# Patient Record
Sex: Female | Born: 1965 | Race: White | Hispanic: No | Marital: Married | Smoking: Never smoker
Health system: Southern US, Community
[De-identification: ages and names within clinical notes are randomized; demographics above are authoritative.]

## PROBLEM LIST (undated history)

## (undated) DIAGNOSIS — G8929 Other chronic pain: Secondary | ICD-10-CM

## (undated) DIAGNOSIS — G629 Polyneuropathy, unspecified: Secondary | ICD-10-CM

## (undated) DIAGNOSIS — K219 Gastro-esophageal reflux disease without esophagitis: Secondary | ICD-10-CM

## (undated) DIAGNOSIS — R519 Headache, unspecified: Secondary | ICD-10-CM

## (undated) DIAGNOSIS — Z8619 Personal history of other infectious and parasitic diseases: Secondary | ICD-10-CM

## (undated) DIAGNOSIS — R51 Headache: Secondary | ICD-10-CM

## (undated) DIAGNOSIS — C9590 Leukemia, unspecified not having achieved remission: Secondary | ICD-10-CM

## (undated) DIAGNOSIS — M545 Low back pain, unspecified: Secondary | ICD-10-CM

## (undated) DIAGNOSIS — E559 Vitamin D deficiency, unspecified: Secondary | ICD-10-CM

## (undated) DIAGNOSIS — J45909 Unspecified asthma, uncomplicated: Secondary | ICD-10-CM

## (undated) DIAGNOSIS — Z9289 Personal history of other medical treatment: Secondary | ICD-10-CM

## (undated) DIAGNOSIS — Z9481 Bone marrow transplant status: Secondary | ICD-10-CM

## (undated) DIAGNOSIS — C9201 Acute myeloblastic leukemia, in remission: Secondary | ICD-10-CM

## (undated) HISTORY — PX: CHOLECYSTECTOMY: SHX55

## (undated) HISTORY — DX: Vitamin D deficiency, unspecified: E55.9

## (undated) HISTORY — PX: SHOULDER SURGERY: SHX246

## (undated) HISTORY — DX: Personal history of other infectious and parasitic diseases: Z86.19

## (undated) HISTORY — DX: Acute myeloblastic leukemia, in remission: C92.01

## (undated) HISTORY — PX: BONE MARROW TRANSPLANT: SHX200

## (undated) HISTORY — PX: SPINAL CORD STIMULATOR IMPLANT: SHX2422

## (undated) HISTORY — PX: ABDOMINAL HYSTERECTOMY: SHX81

---

## 1997-09-29 ENCOUNTER — Ambulatory Visit (HOSPITAL_COMMUNITY): Admission: RE | Admit: 1997-09-29 | Discharge: 1997-09-29 | Payer: Self-pay | Admitting: Family Medicine

## 1997-10-21 ENCOUNTER — Ambulatory Visit (HOSPITAL_COMMUNITY): Admission: RE | Admit: 1997-10-21 | Discharge: 1997-10-21 | Payer: Self-pay | Admitting: Family Medicine

## 1998-04-24 ENCOUNTER — Encounter: Payer: Self-pay | Admitting: Family Medicine

## 1998-04-24 ENCOUNTER — Ambulatory Visit (HOSPITAL_COMMUNITY): Admission: RE | Admit: 1998-04-24 | Discharge: 1998-04-24 | Payer: Self-pay | Admitting: Family Medicine

## 1999-06-13 ENCOUNTER — Ambulatory Visit (HOSPITAL_COMMUNITY): Admission: RE | Admit: 1999-06-13 | Discharge: 1999-06-13 | Payer: Self-pay | Admitting: Family Medicine

## 1999-06-13 ENCOUNTER — Encounter: Payer: Self-pay | Admitting: Family Medicine

## 2001-03-12 ENCOUNTER — Encounter: Admission: RE | Admit: 2001-03-12 | Discharge: 2001-03-12 | Payer: Self-pay | Admitting: Obstetrics and Gynecology

## 2001-03-12 ENCOUNTER — Encounter: Payer: Self-pay | Admitting: Obstetrics and Gynecology

## 2001-03-16 ENCOUNTER — Other Ambulatory Visit: Admission: RE | Admit: 2001-03-16 | Discharge: 2001-03-16 | Payer: Self-pay | Admitting: Obstetrics and Gynecology

## 2002-05-08 ENCOUNTER — Other Ambulatory Visit: Admission: RE | Admit: 2002-05-08 | Discharge: 2002-05-08 | Payer: Self-pay | Admitting: Obstetrics and Gynecology

## 2004-02-17 ENCOUNTER — Encounter: Admission: RE | Admit: 2004-02-17 | Discharge: 2004-02-17 | Payer: Self-pay | Admitting: Obstetrics and Gynecology

## 2004-02-17 ENCOUNTER — Other Ambulatory Visit: Admission: RE | Admit: 2004-02-17 | Discharge: 2004-02-17 | Payer: Self-pay | Admitting: Obstetrics and Gynecology

## 2005-03-17 ENCOUNTER — Encounter: Admission: RE | Admit: 2005-03-17 | Discharge: 2005-03-17 | Payer: Self-pay | Admitting: Obstetrics and Gynecology

## 2011-04-05 DIAGNOSIS — C9590 Leukemia, unspecified not having achieved remission: Secondary | ICD-10-CM

## 2011-04-05 HISTORY — DX: Leukemia, unspecified not having achieved remission: C95.90

## 2013-09-28 ENCOUNTER — Emergency Department (HOSPITAL_COMMUNITY)
Admission: EM | Admit: 2013-09-28 | Discharge: 2013-09-28 | Disposition: A | Discharge status: Home / Self Care | Payer: BC Managed Care – PPO | Source: Home / Self Care | Attending: Emergency Medicine | Admitting: Emergency Medicine

## 2013-09-28 ENCOUNTER — Emergency Department (INDEPENDENT_AMBULATORY_CARE_PROVIDER_SITE_OTHER): Payer: BC Managed Care – PPO

## 2013-09-28 ENCOUNTER — Encounter (HOSPITAL_COMMUNITY): Payer: Self-pay | Admitting: Emergency Medicine

## 2013-09-28 DIAGNOSIS — S93439A Sprain of tibiofibular ligament of unspecified ankle, initial encounter: Secondary | ICD-10-CM

## 2013-09-28 DIAGNOSIS — S93491A Sprain of other ligament of right ankle, initial encounter: Secondary | ICD-10-CM

## 2013-09-28 DIAGNOSIS — S93431A Sprain of tibiofibular ligament of right ankle, initial encounter: Principal | ICD-10-CM

## 2013-09-28 HISTORY — DX: Unspecified asthma, uncomplicated: J45.909

## 2013-09-28 HISTORY — DX: Other chronic pain: G89.29

## 2013-09-28 HISTORY — DX: Leukemia, unspecified not having achieved remission: C95.90

## 2013-09-28 HISTORY — DX: Bone marrow transplant status: Z94.81

## 2013-09-28 HISTORY — DX: Polyneuropathy, unspecified: G62.9

## 2013-09-28 HISTORY — DX: Low back pain, unspecified: M54.50

## 2013-09-28 HISTORY — DX: Low back pain: M54.5
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
## 2013-09-28 NOTE — Discharge Instructions (Signed)
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
## 2014-02-10 ENCOUNTER — Telehealth: Payer: Self-pay | Admitting: Hematology and Oncology

## 2014-02-10 NOTE — Telephone Encounter (Signed)
S/W PATIENT AND GAVE NP APPT FOR 11/19 @ 9:45 W/DR. [place].  WELCOME PACKET MAILED.

## 2014-02-20 ENCOUNTER — Encounter (INDEPENDENT_AMBULATORY_CARE_PROVIDER_SITE_OTHER): Payer: Self-pay

## 2014-02-20 ENCOUNTER — Ambulatory Visit (HOSPITAL_BASED_OUTPATIENT_CLINIC_OR_DEPARTMENT_OTHER): Payer: 59 | Admitting: Hematology and Oncology

## 2014-02-20 ENCOUNTER — Telehealth: Payer: Self-pay | Admitting: Hematology and Oncology

## 2014-02-20 ENCOUNTER — Encounter: Payer: Self-pay | Admitting: Hematology and Oncology

## 2014-02-20 ENCOUNTER — Ambulatory Visit (HOSPITAL_BASED_OUTPATIENT_CLINIC_OR_DEPARTMENT_OTHER): Payer: 59

## 2014-02-20 ENCOUNTER — Ambulatory Visit: Payer: 59

## 2014-02-20 VITALS — BP 113/62 | HR 70 | Temp 97.9°F | Resp 18 | Ht 60.0 in | Wt 165.9 lb

## 2014-02-20 DIAGNOSIS — G629 Polyneuropathy, unspecified: Secondary | ICD-10-CM

## 2014-02-20 DIAGNOSIS — C9201 Acute myeloblastic leukemia, in remission: Secondary | ICD-10-CM

## 2014-02-20 DIAGNOSIS — Z8619 Personal history of other infectious and parasitic diseases: Secondary | ICD-10-CM

## 2014-02-20 DIAGNOSIS — C801 Malignant (primary) neoplasm, unspecified: Secondary | ICD-10-CM

## 2014-02-20 DIAGNOSIS — M899 Disorder of bone, unspecified: Secondary | ICD-10-CM

## 2014-02-20 DIAGNOSIS — R5383 Other fatigue: Secondary | ICD-10-CM | POA: Insufficient documentation

## 2014-02-20 DIAGNOSIS — R5382 Chronic fatigue, unspecified: Secondary | ICD-10-CM

## 2014-02-20 DIAGNOSIS — M898X9 Other specified disorders of bone, unspecified site: Secondary | ICD-10-CM

## 2014-02-20 DIAGNOSIS — G63 Polyneuropathy in diseases classified elsewhere: Secondary | ICD-10-CM

## 2014-02-20 DIAGNOSIS — Z299 Encounter for prophylactic measures, unspecified: Secondary | ICD-10-CM | POA: Insufficient documentation

## 2014-02-20 HISTORY — DX: Acute myeloblastic leukemia, in remission: C92.01

## 2014-02-20 HISTORY — DX: Personal history of other infectious and parasitic diseases: Z86.19

## 2014-02-20 LAB — CBC WITH DIFFERENTIAL/PLATELET
BASO%: 0 % (ref 0.0–2.0)
BASOS ABS: 0 10*3/uL (ref 0.0–0.1)
EOS%: 2.2 % (ref 0.0–7.0)
Eosinophils Absolute: 0.1 10*3/uL (ref 0.0–0.5)
HCT: 37.8 % (ref 34.8–46.6)
HEMOGLOBIN: 12.9 g/dL (ref 11.6–15.9)
LYMPH%: 41.4 % (ref 14.0–49.7)
MCH: 32.7 pg (ref 25.1–34.0)
MCHC: 34.1 g/dL (ref 31.5–36.0)
MCV: 95.7 fL (ref 79.5–101.0)
MONO#: 0.5 10*3/uL (ref 0.1–0.9)
MONO%: 11 % (ref 0.0–14.0)
NEUT%: 45.4 % (ref 38.4–76.8)
NEUTROS ABS: 1.9 10*3/uL (ref 1.5–6.5)
Platelets: 171 10*3/uL (ref 145–400)
RBC: 3.95 10*6/uL (ref 3.70–5.45)
RDW: 12.9 % (ref 11.2–14.5)
WBC: 4.1 10*3/uL (ref 3.9–10.3)
lymph#: 1.7 10*3/uL (ref 0.9–3.3)

## 2014-02-20 LAB — COMPREHENSIVE METABOLIC PANEL (CC13)
ALBUMIN: 3.8 g/dL (ref 3.5–5.0)
ALK PHOS: 69 U/L (ref 40–150)
ALT: 16 U/L (ref 0–55)
ANION GAP: 9 meq/L (ref 3–11)
AST: 16 U/L (ref 5–34)
BILIRUBIN TOTAL: 0.4 mg/dL (ref 0.20–1.20)
BUN: 18.7 mg/dL (ref 7.0–26.0)
CO2: 25 mEq/L (ref 22–29)
Calcium: 9.7 mg/dL (ref 8.4–10.4)
Chloride: 107 mEq/L (ref 98–109)
Creatinine: 0.9 mg/dL (ref 0.6–1.1)
GLUCOSE: 90 mg/dL (ref 70–140)
POTASSIUM: 4.2 meq/L (ref 3.5–5.1)
Sodium: 141 mEq/L (ref 136–145)
Total Protein: 6.6 g/dL (ref 6.4–8.3)

## 2014-02-20 LAB — LACTATE DEHYDROGENASE (CC13): LDH: 203 U/L (ref 125–245)

## 2014-02-20 LAB — MORPHOLOGY: PLT EST: ADEQUATE

## 2014-02-20 MED ORDER — VALACYCLOVIR HCL 1 G PO TABS: 1000.0000 mg | ORAL_TABLET | Freq: Every day | ORAL | Status: DC

## 2014-02-20 MED ORDER — VALACYCLOVIR HCL 1 G PO TABS
1000.0000 mg | ORAL_TABLET | Freq: Every day | ORAL | Status: DC
Start: 2014-02-20 — End: 2014-05-23

## 2014-02-20 NOTE — Assessment & Plan Note (Signed)
I have requested the pharmacy to a pain records from [facility] regarding her vaccination records. The patient appears to be delayed in getting the appropriate vaccination according to the post-transplant preventive vaccination schedule. Once I have obtained records, we will schedule vaccination for her.

## 2014-02-20 NOTE — Progress Notes (Signed)
Checked in new pt with no financial concerns at this time.  Pt has my card for any questions or concerns.

## 2014-02-20 NOTE — Telephone Encounter (Signed)
Gave avs & cal for Feb 2016. °

## 2014-02-20 NOTE — Assessment & Plan Note (Signed)
The cause of her neuropathy is unknown. Certainly, graft-versus-host disease can cause neuropathy. She also has history of chronic back pain with neurostimulator placed. The pattern of neuropathy is more peripheral. She is responding to Lyrica. I will recommend she continue to do so.

## 2014-02-20 NOTE — Assessment & Plan Note (Signed)
She has chronic fatigue and had history of subclinical hypothyroidism. Repeat thyroid function test done recently and is normal. I reassured the patient.

## 2014-02-20 NOTE — Assessment & Plan Note (Signed)
She has recurrent shingles. I refilled her prescription of Valtrex at 1000 mg daily. I will check her immunoglobulin levels. If she has recurrence of shingles again, I might try IVIG treatment next.

## 2014-02-20 NOTE — Assessment & Plan Note (Signed)
She has chronic bone pain but clinically does not looks like graft-versus-host disease. I recommend high-dose vitamin D supplements for now. In the meantime, she is on Lyrica which seems to help.

## 2014-02-20 NOTE — Assessment & Plan Note (Addendum)
Her blood work and clinical exam suggests complete remission. I reassured the patient. However, she has a lot of other medical issues. I will see her back in 3 months with repeat history, physical examination and blood work. I asked her to sign consent to release information so I can request also records from [facility] and [employer].

## 2014-02-20 NOTE — Progress Notes (Signed)
[facility] CONSULT NOTE  Patient Care Team: Scott Holwerda, MD as PCP - General (Internal Medicine)  CHIEF COMPLAINTS/PURPOSE OF CONSULTATION:  AML in remission  HISTORY OF PRESENTING ILLNESS:  Cynthia Steele 48 y.o. female is here because of cyanosis of AML in remission. Recently, she restricted insurance and was unable to return to [facility]. The patient was diagnosed with AML on 10/20/2011. She presented to her primary care physician was sore throat and was noted to have abnormal blood count. 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 She received several cycles of chemotherapy followed by bone marrow transplant. Her sister was a perfect match and was a donor. 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  Outside records were not available. After her treatment, she describes severe bone pain and peripheral neuropathy. She does not recall significant infection issues surrounding the time of chemotherapy or transplant. The patient had significant weight gain of over 20 pounds since early this year. She was told she have mild adrenal insufficiency and hypothyroidism and was placed on steroids treatment along with replacement therapy that were subsequently discontinued. She has chronic back pain and had implantation of cortical stimulator in 2013 but that was not removed. She also had back or history of abnormal Pap smear in the past status to hysterectomy. She complain of chronic fatigue. She had recurrent shingles 6 according to the patient and have been on Valtrex long-term up until a month ago when her prescription ran out. She felt that she had missed her vaccination program at [facility] due to insurance issue.  MEDICAL HISTORY:  Past Medical History  Diagnosis Date  . Leukemia   . Asthma   . Chronic low back pain   . Peripheral neuropathy   . H/O bone marrow transplant   .  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 History of shingles 02/20/2014    SURGICAL HISTORY: Past Surgical History  Procedure Laterality Date  . Bone marrow transplant    . Spinal cord stimulator implant    . Shoulder surgery      x2  . Abdominal hysterectomy      SOCIAL HISTORY: History   Social History  . Marital Status: Married    Spouse Name: N/A    Number of Children: N/A  . Years of Education: N/A   Occupational History  . Not on file.   Social History Main Topics  . Smoking status: Never Smoker   . Smokeless tobacco: Never Used  . Alcohol Use: No  . Drug Use: No  . Sexual Activity: Not on file   Other Topics Concern  . Not on file   Social History Narrative    FAMILY HISTORY: History reviewed. No pertinent family history.  ALLERGIES:  is allergic to ultram.  MEDICATIONS:  Current Outpatient Prescriptions  Medication Sig Dispense Refill  . fluticasone (FLONASE) 50 MCG/ACT nasal spray Place into both nostrils daily.    . LORazepam (ATIVAN) 0.5 MG tablet Take 0.5 mg by mouth at bedtime.    . omeprazole (PRILOSEC) 40 MG capsule Take by mouth 2 (two) times daily.     . OxyCODONE HCl, Abuse Deter, 5 MG TABA Take by mouth.    . pregabalin (LYRICA) 50 MG capsule Take 50 mg by mouth 2 (two) times daily.    . UNABLE TO FIND Buspar patch    . valACYclovir (VALTREX) 1000 MG tablet Take 1 tablet (1,000   mg total) by mouth daily. 90 tablet 3   No current facility-administered medications for this visit.    REVIEW OF SYSTEMS:   Constitutional: Denies fevers, chills or abnormal night sweats Eyes: Denies blurriness of vision, double vision or watery eyes Ears, nose, mouth, throat, and face: Denies mucositis or sore throat Respiratory: Denies cough, dyspnea or wheezes Cardiovascular: Denies palpitation, chest discomfort or lower extremity swelling Gastrointestinal:  Denies nausea, heartburn or change in bowel habits Skin: Denies abnormal skin  rashes Lymphatics: Denies new lymphadenopathy or easy bruising Neurological:Denies numbness, tingling or new weaknesses Behavioral/Psych: Mood is stable, no new changes  All other systems were reviewed with the patient and are negative.  PHYSICAL EXAMINATION: ECOG PERFORMANCE STATUS: 1 - Symptomatic but completely ambulatory  Filed Vitals:   02/20/14 1001  BP: 113/62  Pulse: 70  Temp: 97.9 F (36.6 C)  Resp: 18   Filed Weights   02/20/14 1001  Weight: 165 lb 14.4 oz (75.252 kg)    GENERAL:alert, no distress and comfortable SKIN: skin color, texture, turgor are normal, no rashes or significant lesions EYES: normal, conjunctiva are pink and non-injected, sclera clear OROPHARYNX:no exudate, no erythema and lips, buccal mucosa, and tongue normal  NECK: supple, thyroid normal size, non-tender, without nodularity LYMPH:  no palpable lymphadenopathy in the cervical, axillary or inguinal LUNGS: clear to auscultation and percussion with normal breathing effort HEART: regular rate & rhythm and no murmurs and no lower extremity edema ABDOMEN:abdomen soft, non-tender and normal bowel sounds Musculoskeletal:no cyanosis of digits and no clubbing  PSYCH: alert & oriented x 3 with fluent speech NEURO: no focal motor/sensory deficits  LABORATORY DATA:  I have reviewed the data as listed Lab Results  Component Value Date   WBC 4.1 02/20/2014   HGB 12.9 02/20/2014   HCT 37.8 02/20/2014   MCV 95.7 02/20/2014   PLT 171 02/20/2014    Recent Labs  02/20/14 1120  NA 141  K 4.2  CO2 25  GLUCOSE 90  BUN 18.7  CREATININE 0.9  CALCIUM 9.7  PROT 6.6  ALBUMIN 3.8  AST 16  ALT 16  ALKPHOS 69  BILITOT 0.40   ASSESSMENT & PLAN:  AML (acute myeloid leukemia) in remission Her blood work and clinical exam suggests complete remission. I reassured the patient. However, she has a lot of other medical issues. I will see her back in 3 months with repeat history, physical examination and  blood work. I asked her to sign consent to release information so I can request also records from [facility] and [employer].  History of shingles She has recurrent shingles. I refilled her prescription of Valtrex at 1000 mg daily. I will check her immunoglobulin levels. If she has recurrence of shingles again, I might try IVIG treatment next.  Bone pain She has chronic bone pain but clinically does not looks like graft-versus-host disease. I recommend high-dose vitamin D supplements for now. In the meantime, she is on Lyrica which seems to help.  Neuropathy associated with cancer The cause of her neuropathy is unknown. Certainly, graft-versus-host disease can cause neuropathy. She also has history of chronic back pain with neurostimulator placed. The pattern of neuropathy is more peripheral. She is responding to Lyrica. I will recommend she continue to do so.  Fatigue She has chronic fatigue and had history of subclinical hypothyroidism. Repeat thyroid function test done recently and is normal. I reassured the patient.  Preventive measure I have requested the pharmacy to a pain records from  [place] regarding her vaccination records. The patient appears to be delayed in getting the appropriate vaccination according to the post-transplant preventive vaccination schedule. Once I have obtained records, we will schedule vaccination for her.    Orders Placed This Encounter  Procedures  . CBC with Differential    Standing Status: Standing     Number of Occurrences: 2     Standing Expiration Date: 02/21/2015  . Comprehensive metabolic panel    Standing Status: Standing     Number of Occurrences: 2     Standing Expiration Date: 02/21/2015  . Lactate dehydrogenase    Standing Status: Standing     Number of Occurrences: 2     Standing Expiration Date: 02/21/2015  . Morphology    Standing Status: Standing     Number of Occurrences: 2     Standing Expiration Date:  02/21/2015  . IgG, IgA, IgM    Standing Status: Future     Number of Occurrences: 1     Standing Expiration Date: 03/27/2015  . Vitamin D 25 hydroxy    Standing Status: Future     Number of Occurrences: 1     Standing Expiration Date: 02/20/2015    All questions were answered. The patient knows to call the clinic with any problems, questions or concerns. I spent 55 minutes counseling the patient face to face. The total time spent in the appointment was 60 minutes and more than 50% was on counseling.     [facility], [facility], MD 02/20/2014 2:03 PM

## 2014-02-21 ENCOUNTER — Telehealth: Payer: Self-pay | Admitting: *Deleted

## 2014-02-21 ENCOUNTER — Emergency Department (HOSPITAL_COMMUNITY): Payer: No Typology Code available for payment source

## 2014-02-21 ENCOUNTER — Emergency Department (HOSPITAL_COMMUNITY)
Admission: EM | Admit: 2014-02-21 | Discharge: 2014-02-21 | Disposition: A | Discharge status: Home / Self Care | Payer: No Typology Code available for payment source | Attending: Emergency Medicine | Admitting: Emergency Medicine

## 2014-02-21 ENCOUNTER — Encounter (HOSPITAL_COMMUNITY): Payer: Self-pay | Admitting: *Deleted

## 2014-02-21 DIAGNOSIS — Y92410 Unspecified street and highway as the place of occurrence of the external cause: Secondary | ICD-10-CM | POA: Diagnosis not present

## 2014-02-21 DIAGNOSIS — Y998 Other external cause status: Secondary | ICD-10-CM | POA: Diagnosis not present

## 2014-02-21 DIAGNOSIS — S8991XA Unspecified injury of right lower leg, initial encounter: Secondary | ICD-10-CM | POA: Insufficient documentation

## 2014-02-21 DIAGNOSIS — Z9481 Bone marrow transplant status: Secondary | ICD-10-CM | POA: Diagnosis not present

## 2014-02-21 DIAGNOSIS — Z8619 Personal history of other infectious and parasitic diseases: Secondary | ICD-10-CM | POA: Insufficient documentation

## 2014-02-21 DIAGNOSIS — Y9389 Activity, other specified: Secondary | ICD-10-CM | POA: Diagnosis not present

## 2014-02-21 DIAGNOSIS — Z856 Personal history of leukemia: Secondary | ICD-10-CM | POA: Insufficient documentation

## 2014-02-21 DIAGNOSIS — Z7951 Long term (current) use of inhaled steroids: Secondary | ICD-10-CM | POA: Insufficient documentation

## 2014-02-21 DIAGNOSIS — M25561 Pain in right knee: Secondary | ICD-10-CM

## 2014-02-21 DIAGNOSIS — J45909 Unspecified asthma, uncomplicated: Secondary | ICD-10-CM | POA: Diagnosis not present

## 2014-02-21 DIAGNOSIS — Z79899 Other long term (current) drug therapy: Secondary | ICD-10-CM | POA: Insufficient documentation

## 2014-02-21 DIAGNOSIS — G8929 Other chronic pain: Secondary | ICD-10-CM | POA: Insufficient documentation

## 2014-02-21 DIAGNOSIS — M25571 Pain in right ankle and joints of right foot: Secondary | ICD-10-CM

## 2014-02-21 LAB — IGG, IGA, IGM
IGG (IMMUNOGLOBIN G), SERUM: 692 mg/dL (ref 690–1700)
IgA: 99 mg/dL (ref 69–380)
IgM, Serum: 33 mg/dL — ABNORMAL LOW (ref 52–322)

## 2014-02-21 LAB — VITAMIN D 25 HYDROXY (VIT D DEFICIENCY, FRACTURES): VIT D 25 HYDROXY: 22 ng/mL — AB (ref 30–100)

## 2014-02-21 MED ORDER — NAPROXEN 500 MG PO TABS: 500.0000 mg | ORAL_TABLET | Freq: Two times a day (BID) | ORAL | Status: DC

## 2014-02-21 MED ORDER — ACETAMINOPHEN 325 MG PO TABS
650.0000 mg | ORAL_TABLET | ORAL | Status: AC
  Administered 2014-02-21: 650 mg via ORAL
  Filled 2014-02-21: qty 2

## 2014-02-21 NOTE — Telephone Encounter (Signed)
Pt notified of message below. Verbalized understanding 
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
## 2014-02-21 NOTE — Telephone Encounter (Signed)
Left message to call.

## 2014-02-21 NOTE — Discharge Instructions (Signed)

## 2014-02-21 NOTE — ED Notes (Signed)
Patient returned from imaging.

## 2014-02-21 NOTE — Telephone Encounter (Signed)
-----   Message from Heath Lark, MD sent at 02/21/2014  7:54 AM EST ----- Regarding: severe vitamin D def I recommend OTC vitamin D 5000 units daily X 1 month then 2000 daily ----- Message -----    From: [facility]    Sent: 02/20/2014  11:37 AM      To: Heath Lark, MD
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
## 2014-02-25 ENCOUNTER — Telehealth: Payer: Self-pay | Admitting: *Deleted

## 2014-02-25 NOTE — Telephone Encounter (Signed)
-----  Message from Heath Lark, MD sent at 02/25/2014 11:45 AM EST ----- Regarding: RE: post-transplant vaccination Thanks, Gerald Stabs. Came[place], can you update this in her vaccination records and ask when can she come in for inj and I will order it ----- Message -----    From: Shirlean Mylar, [place]    Sent: 02/24/2014   4:50 PM      To: Heath Lark, MD Subject: RE: post-transplant vaccination                Hey Dr. Alvy Bimler,  Sorry for the Delay on the vaccination schedule.  So it appears to me that Ms. Trillo only received her 12 month vaccines back in October of 2014. She received, Tdap, Hib, IPV, HepB, and Prevnar 13.  From what I can tell she did not receive any vaccines at month 14 (usually a 2nd dose of all the above).   She is due for her 24 month post transplant vaccines.   These usually consist of:   Tdap   Hib  IPV  HepB  Pneumovax 23 (not prevnar 13)  And MMR  Let me know if this is what you were looking for and if you need anything else (we have these vaccines in stock)  Thanks!  Gerald Stabs ----- Message -----    From: Heath Lark, MD    Sent: 02/20/2014  10:28 AM      To: Shirlean Mylar, RPH Subject: post-transplant vaccination                    Can you check and help me find out what kind of vaccination she will need? Thanks

## 2014-02-28 ENCOUNTER — Telehealth: Payer: Self-pay | Admitting: *Deleted

## 2014-02-28 ENCOUNTER — Telehealth: Payer: Self-pay | Admitting: Hematology and Oncology

## 2014-02-28 NOTE — Telephone Encounter (Signed)
s.w. pt and advised on DEc appt...ok and aware

## 2014-02-28 NOTE — Telephone Encounter (Signed)
S/w pt regarding due for 24 month post transplant vaccines.  Pt wants to come in on 12/02 Wednesday morning for her vaccines.   Request sent to scheduling to add to Injection Nurse.  Pt also asks Dr. Alvy Bimler for refill on her Lorazepam 0.5 mg daily at [facility].  States her Oncologist at [facility] prescribed this for her to keep her shingles away.  Pt states she gets shingles when she gets stressed.  Asks if Dr. Alvy Bimler will refill?

## 2014-03-03 ENCOUNTER — Other Ambulatory Visit: Payer: Self-pay | Admitting: Hematology and Oncology

## 2014-03-03 DIAGNOSIS — C9201 Acute myeloblastic leukemia, in remission: Secondary | ICD-10-CM

## 2014-03-03 MED ORDER — LORAZEPAM 0.5 MG PO TABS: 0.5000 mg | ORAL_TABLET | Freq: Every day | ORAL | Status: DC

## 2014-03-05 ENCOUNTER — Ambulatory Visit (HOSPITAL_BASED_OUTPATIENT_CLINIC_OR_DEPARTMENT_OTHER): Payer: 59

## 2014-03-05 DIAGNOSIS — Z23 Encounter for immunization: Secondary | ICD-10-CM | POA: Diagnosis not present

## 2014-03-05 DIAGNOSIS — C9201 Acute myeloblastic leukemia, in remission: Secondary | ICD-10-CM

## 2014-03-05 MED ORDER — HAEMOPHILUS B POLYSAC CONJ VAC IM SOLR
0.5000 mL | Freq: Once | INTRAMUSCULAR | Status: AC
  Administered 2014-03-05: 0.5 mL via INTRAMUSCULAR
  Filled 2014-03-05: qty 0.5

## 2014-03-05 MED ORDER — DTAP-HEPATITIS B RECOMB-IPV IM SUSP
0.5000 mL | Freq: Once | INTRAMUSCULAR | Status: AC
  Administered 2014-03-05: 0.5 mL via INTRAMUSCULAR
  Filled 2014-03-05: qty 0.5

## 2014-03-05 MED ORDER — MEASLES, MUMPS & RUBELLA VAC ~~LOC~~ INJ
0.5000 mL | INJECTION | Freq: Once | SUBCUTANEOUS | Status: AC
  Administered 2014-03-05: 0.5 mL via SUBCUTANEOUS
  Filled 2014-03-05: qty 0.5

## 2014-03-05 MED ORDER — PNEUMOCOCCAL VAC POLYVALENT 25 MCG/0.5ML IJ INJ
0.5000 mL | INJECTION | Freq: Once | INTRAMUSCULAR | Status: AC
  Administered 2014-03-05: 0.5 mL via INTRAMUSCULAR
  Filled 2014-03-05: qty 0.5

## 2014-03-05 NOTE — Patient Instructions (Signed)
Measles/Mumps/Rubella Vaccines, MMR injection What is this medicine? MEASLES VIRUS; MUMPS VIRUS; RUBELLA VIRUS VACCINE LIVE (MEE zuhlz VAHY ruhs; muhmps VAHY ruhs; roo bel uh VAHY ruhs vak SEEN Cynthia Steele ) is used to prevent an infection with measles (rubeola), mumps, and rubella ([place] measles) viruses. It is used to prevent infection in children over 86 months old, adults that have not been vaccinated and are not pregnant, and anyone traveling to countries where there are high rates of measles, mumps, or rubella. This medicine may be used for other purposes; ask your health care provider or pharmacist if you have questions. COMMON BRAND NAME(S): M-M-R II What should I tell my health care provider before I take this medicine? They need to know if you have any of these conditions: -bleeding disorder -cancer including leukemia or lymphoma -immune system problems -infection with fever -low levels of platelets in the blood -recent blood transfusion or immune globulin infusion -seizure disorder -taking medicines for immunosuppression -an unusual or allergic reaction to vaccines, eggs, neomycin, gelatin, other medicines, foods, dyes, or preservatives -pregnant or trying to get pregnant -breast-feeding How should I use this medicine? This vaccine is for injection under the skin. It is given by a health care professional. A copy of Vaccine Information Statements will be given before each vaccination. Read this sheet carefully each time. The sheet may change frequently. Talk to your pediatrician regarding the use of this medicine in children. While this drug may be prescribed for children as young as 54 months of age for selected conditions, precautions do apply. Overdosage: If you think you have taken too much of this medicine contact a poison control center or emergency room at once. NOTE: This medicine is only for you. Do not share this medicine with others. What if I miss a dose? Keep appointments  for follow-up (booster) doses as directed. It is important not to miss your dose. Call your doctor or health care professional if you are unable to keep an appointment. What may interact with this medicine? Do not take this medicine with any of the following medications: -adalimumab -anakinra -etanercept -infliximab -medicines that suppress your immune system -medicines to treat cancer This medicine may also interact with the following medications: -immune globulins -live virus vaccines This list may not describe all possible interactions. Give your health care provider a list of all the medicines, herbs, non-prescription drugs, or dietary supplements you use. Also tell them if you smoke, drink alcohol, or use illegal drugs. Some items may interact with your medicine. What should I watch for while using this medicine? Visit your doctor for check-ups as directed. Do not become pregnant for 3 months after receiving this vaccine. Women should inform their doctor if they wish to become pregnant or think they might be pregnant. There is a potential for serious side effects to an unborn child. Talk to your health care professional or pharmacist for more information. What side effects may I notice from receiving this medicine? Side effects that you should report to your doctor or health care professional as soon as possible: -allergic reactions like skin rash, itching or hives, swelling of the face, lips, or tongue -breathing problems -changes in hearing -changes in vision -difficulty walking -extreme changes in behavior -fast, irregular heartbeat -fever over 100 degrees F -pain, tingling, numbness in the hands or feet -seizures -unusual bleeding or bruising -unusually weak or tired Side effects that usually do not require medical attention (report to your doctor or health care professional if they continue or  are bothersome): -aches or pains -bruising, pain, swelling at site where  injected -diarrhea -headache -low-grade fever of 100 degrees F or less -nausea, vomiting -runny nose, cough -sleepy -swollen glands This list may not describe all possible side effects. Call your doctor for medical advice about side effects. You may report side effects to FDA at 1-800-FDA-1088. Where should I keep my medicine? This drug is given in a hospital or clinic and will not be stored at home. NOTE: This sheet is a summary. It may not cover all possible information. If you have questions about this medicine, talk to your doctor, pharmacist, or health care provider.  2015, Elsevier/Gold Standard. (2013-04-19 11:04:43) Pneumococcal Vaccine, Polyvalent solution for injection What is this medicine? PNEUMOCOCCAL VACCINE, POLYVALENT (NEU mo KOK al vak SEEN, pol ee VEY luhnt) is a vaccine to prevent pneumococcus bacteria infection. These bacteria are a major cause of ear infections, Strep throat infections, and serious pneumonia, meningitis, or blood infections worldwide. These vaccines help the body to produce antibodies (protective substances) that help your body defend against these bacteria. This vaccine is recommended for people 44 years of age and older with health problems. It is also recommended for all adults over 31 years old. This vaccine will not treat an infection. This medicine may be used for other purposes; ask your health care provider or pharmacist if you have questions. COMMON BRAND NAME(S): Pneumovax 23 What should I tell my health care provider before I take this medicine? They need to know if you have any of these conditions: -bleeding problems -bone marrow or organ transplant -cancer, Hodgkin's disease -fever -infection -immune system problems -low platelet count in the blood -seizures -an unusual or allergic reaction to pneumococcal vaccine, diphtheria toxoid, other vaccines, latex, other medicines, foods, dyes, or preservatives -pregnant or trying to get  pregnant -breast-feeding How should I use this medicine? This vaccine is for injection into a muscle or under the skin. It is given by a health care professional. A copy of Vaccine Information Statements will be given before each vaccination. Read this sheet carefully each time. The sheet may change frequently. Talk to your pediatrician regarding the use of this medicine in children. While this drug may be prescribed for children as young as 78 years of age for selected conditions, precautions do apply. Overdosage: If you think you have taken too much of this medicine contact a poison control center or emergency room at once. NOTE: This medicine is only for you. Do not share this medicine with others. What if I miss a dose? It is important not to miss your dose. Call your doctor or health care professional if you are unable to keep an appointment. What may interact with this medicine? -medicines for cancer chemotherapy -medicines that suppress your immune function -medicines that treat or prevent blood clots like warfarin, enoxaparin, and dalteparin -steroid medicines like prednisone or cortisone This list may not describe all possible interactions. Give your health care provider a list of all the medicines, herbs, non-prescription drugs, or dietary supplements you use. Also tell them if you smoke, drink alcohol, or use illegal drugs. Some items may interact with your medicine. What should I watch for while using this medicine? Mild fever and pain should go away in 3 days or less. Report any unusual symptoms to your doctor or health care professional. What side effects may I notice from receiving this medicine? Side effects that you should report to your doctor or health care professional as soon as possible: -  allergic reactions like skin rash, itching or hives, swelling of the face, lips, or tongue -breathing problems -confused -fever over 102 degrees F -pain, tingling, numbness in the hands  or feet -seizures -unusual bleeding or bruising -unusual muscle weakness Side effects that usually do not require medical attention (report to your doctor or health care professional if they continue or are bothersome): -aches and pains -diarrhea -fever of 102 degrees F or less -headache -irritable -loss of appetite -pain, tender at site where injected -trouble sleeping This list may not describe all possible side effects. Call your doctor for medical advice about side effects. You may report side effects to FDA at 1-800-FDA-1088. Where should I keep my medicine? This does not apply. This vaccine is given in a clinic, pharmacy, doctor's office, or other health care setting and will not be stored at home. NOTE: This sheet is a summary. It may not cover all possible information. If you have questions about this medicine, talk to your doctor, pharmacist, or health care provider.  2015, Elsevier/Gold Standard. (2007-10-26 14:32:37) Diphtheria, Tetanus, Acellular Pertussis, Hepatitis B, Poliovirus Vaccine What is this medicine? DIPHTHERIA TOXOID, TETANUS TOXOID, ACELLULAR PERTUSSIS VACCINE, DTaP; HEPATITIS B VACCINE, RECOMBINANT; INACTIVATED POLIOVIRUS VACCINE, IPV (dif THEER ee uh TOK soid, TET n [place] TOK soid, ey SEL yuh ler per TUS iss VAK seen, DTaP; hep uh TAHY tis B VAK seen; in ak tuh vey ted poh lee oh vahy ruhs VAK seen, IPV ) is used to prevent infections of diphtheria, tetanus (lockjaw), pertussis (whooping cough), hepatitis B, and poliovirus. This medicine may be used for other purposes; ask your health care provider or pharmacist if you have questions. COMMON BRAND NAME(S): Pediarix What should I tell my health care provider before I take this medicine? They need to know if you have any of these conditions: -infection with fever -neurological disease -seizure disorder -an unusual or allergic reaction to vaccines, yeast, neomycin, polymyxin B, latex, other medicines, foods, dyes, or  preservatives -pregnant or trying to get pregnant -breast-feeding How should I use this medicine? This vaccine is for injection into a muscle. It is given by a health care professional. A copy of Vaccine Information Statements will be given before each vaccination. Read this sheet carefully each time. The sheet may change frequently. Talk to your pediatrician regarding the use of this medicine in children. While this drug may be prescribed for children as young as 64 weeks old for selected conditions, precautions do apply. Overdosage: If you think you have taken too much of this medicine contact a poison control center or emergency room at once. NOTE: This medicine is only for you. Do not share this medicine with others. What if I miss a dose? Keep appointments for follow-up (booster) doses as directed. It is important not to miss your dose. Call your doctor or health care professional if you are unable to keep an appointment. What may interact with this medicine? -adalimumab -anakinra -infliximab -medicines that suppress your immune system -medicines to treat cancer -steroid medicines like prednisone or cortisone This list may not describe all possible interactions. Give your health care provider a list of all the medicines, herbs, non-prescription drugs, or dietary supplements you use. Also tell them if you smoke, drink alcohol, or use illegal drugs. Some items may interact with your medicine. What should I watch for while using this medicine? Visit your doctor for regular check-ups as directed. This vaccine, like all vaccines, may not fully protect everyone. What side effects may I  notice from receiving this medicine? Side effects that you should report to your doctor or health care professional as soon as possible: -allergic reactions like skin rash, itching or hives, swelling of the face, lips, or tongue -blueish color to lips or nail beds -breathing problems -extreme changes in  behavior -fever over 101 degrees F -inconsolable crying for 3 hours or more -seizures -unusual bruising or bleeding -unusually weak or tired Side effects that usually do not require medical attention (report to your doctor or health care professional if they continue or are bothersome): -aches or pains -bruising, pain, swelling at site where injected -diarrhea -fussy -low-grade fever -loss of appetite -sleepy -vomiting This list may not describe all possible side effects. Call your doctor for medical advice about side effects. You may report side effects to FDA at 1-800-FDA-1088. Where should I keep my medicine? This drug is given in a hospital or clinic and will not be stored at home. NOTE: This sheet is a summary. It may not cover all possible information. If you have questions about this medicine, talk to your doctor, pharmacist, or health care provider.  2015, Elsevier/Gold Standard. (2007-08-20 20:51:02)

## 2014-05-23 ENCOUNTER — Encounter: Payer: Self-pay | Admitting: Hematology and Oncology

## 2014-05-23 ENCOUNTER — Other Ambulatory Visit (HOSPITAL_BASED_OUTPATIENT_CLINIC_OR_DEPARTMENT_OTHER): Payer: BLUE CROSS/BLUE SHIELD

## 2014-05-23 ENCOUNTER — Other Ambulatory Visit: Payer: Self-pay | Admitting: Hematology and Oncology

## 2014-05-23 ENCOUNTER — Ambulatory Visit (HOSPITAL_BASED_OUTPATIENT_CLINIC_OR_DEPARTMENT_OTHER): Payer: BLUE CROSS/BLUE SHIELD | Admitting: Hematology and Oncology

## 2014-05-23 ENCOUNTER — Telehealth: Payer: Self-pay | Admitting: Hematology and Oncology

## 2014-05-23 VITALS — BP 115/70 | HR 76 | Temp 97.7°F | Resp 18 | Ht 60.0 in | Wt 172.0 lb

## 2014-05-23 DIAGNOSIS — Z8619 Personal history of other infectious and parasitic diseases: Secondary | ICD-10-CM

## 2014-05-23 DIAGNOSIS — E559 Vitamin D deficiency, unspecified: Secondary | ICD-10-CM | POA: Diagnosis not present

## 2014-05-23 DIAGNOSIS — C801 Malignant (primary) neoplasm, unspecified: Secondary | ICD-10-CM

## 2014-05-23 DIAGNOSIS — Z23 Encounter for immunization: Secondary | ICD-10-CM | POA: Diagnosis not present

## 2014-05-23 DIAGNOSIS — G63 Polyneuropathy in diseases classified elsewhere: Secondary | ICD-10-CM

## 2014-05-23 DIAGNOSIS — C9201 Acute myeloblastic leukemia, in remission: Secondary | ICD-10-CM

## 2014-05-23 DIAGNOSIS — Z299 Encounter for prophylactic measures, unspecified: Secondary | ICD-10-CM

## 2014-05-23 HISTORY — DX: Vitamin D deficiency, unspecified: E55.9

## 2014-05-23 LAB — CBC WITH DIFFERENTIAL/PLATELET
BASO%: 0 % (ref 0.0–2.0)
BASOS ABS: 0 10*3/uL (ref 0.0–0.1)
EOS%: 2 % (ref 0.0–7.0)
Eosinophils Absolute: 0.1 10*3/uL (ref 0.0–0.5)
HCT: 40.1 % (ref 34.8–46.6)
HGB: 13.5 g/dL (ref 11.6–15.9)
LYMPH%: 42.3 % (ref 14.0–49.7)
MCH: 32.4 pg (ref 25.1–34.0)
MCHC: 33.7 g/dL (ref 31.5–36.0)
MCV: 96.2 fL (ref 79.5–101.0)
MONO#: 0.4 10*3/uL (ref 0.1–0.9)
MONO%: 7.7 % (ref 0.0–14.0)
NEUT#: 2.2 10*3/uL (ref 1.5–6.5)
NEUT%: 48 % (ref 38.4–76.8)
PLATELETS: 159 10*3/uL (ref 145–400)
RBC: 4.17 10*6/uL (ref 3.70–5.45)
RDW: 13.5 % (ref 11.2–14.5)
WBC: 4.5 10*3/uL (ref 3.9–10.3)
lymph#: 1.9 10*3/uL (ref 0.9–3.3)

## 2014-05-23 LAB — MORPHOLOGY
PLT EST: ADEQUATE
RBC Comments: NORMAL

## 2014-05-23 LAB — COMPREHENSIVE METABOLIC PANEL (CC13)
ALT: 23 U/L (ref 0–55)
AST: 18 U/L (ref 5–34)
Albumin: 4 g/dL (ref 3.5–5.0)
Alkaline Phosphatase: 71 U/L (ref 40–150)
Anion Gap: 11 mEq/L (ref 3–11)
BILIRUBIN TOTAL: 0.39 mg/dL (ref 0.20–1.20)
BUN: 22.4 mg/dL (ref 7.0–26.0)
CO2: 27 mEq/L (ref 22–29)
CREATININE: 0.9 mg/dL (ref 0.6–1.1)
Calcium: 10.1 mg/dL (ref 8.4–10.4)
Chloride: 105 mEq/L (ref 98–109)
EGFR: 71 mL/min/{1.73_m2} — ABNORMAL LOW (ref >90)
Glucose: 106 mg/dl (ref 70–140)
Potassium: 4.2 mEq/L (ref 3.5–5.1)
Sodium: 142 mEq/L (ref 136–145)
Total Protein: 6.7 g/dL (ref 6.4–8.3)

## 2014-05-23 LAB — LACTATE DEHYDROGENASE (CC13): LDH: 215 U/L (ref 125–245)

## 2014-05-23 MED ORDER — PREGABALIN 50 MG PO CAPS: 50.0000 mg | ORAL_CAPSULE | Freq: Two times a day (BID) | ORAL | Status: AC

## 2014-05-23 MED ORDER — DTAP-HEPATITIS B RECOMB-IPV IM SUSP
0.5000 mL | Freq: Once | INTRAMUSCULAR | Status: AC
  Administered 2014-05-23: 0.5 mL via INTRAMUSCULAR
  Filled 2014-05-23: qty 0.5

## 2014-05-23 MED ORDER — HAEMOPHILUS B POLYSAC CONJ VAC IM SOLR
0.5000 mL | Freq: Once | INTRAMUSCULAR | Status: AC
Start: 2014-05-23 — End: 2014-05-23
  Administered 2014-05-23: 0.5 mL via INTRAMUSCULAR
  Filled 2014-05-23: qty 0.5

## 2014-05-23 NOTE — Telephone Encounter (Signed)
Gave avs & calendar for August °

## 2014-05-24 NOTE — Progress Notes (Signed)
[facility] OFFICE PROGRESS NOTE  Patient Care Team: Velna Hatchet, MD as PCP - General (Internal Medicine)  SUMMARY OF ONCOLOGIC HISTORY:  Cynthia Steele 49 y.o. female is here because of cyanosis of AML in remission. Recently, she restricted insurance and was unable to return to [place]. The patient was diagnosed with AML on 10/20/2011. She presented to her primary care physician was sore throat and was noted to have abnormal blood count. 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 She received several cycles of chemotherapy followed by bone marrow transplant. Her sister was a perfect match and was a donor. 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  Outside records were not available. After her treatment, she describes severe bone pain and peripheral neuropathy. She does not recall significant infection issues surrounding the time of chemotherapy or transplant. The patient had significant weight gain of over 20 pounds since early this year. She was told she have mild adrenal insufficiency and hypothyroidism and was placed on steroids treatment along with replacement therapy that were subsequently discontinued. She has chronic back pain and had implantation of cortical stimulator in 2013 but that was not removed. She also had back or history of abnormal Pap smear in the past status to hysterectomy. She complain of chronic fatigue. She had recurrent shingles 6 according to the patient and have been on Valtrex long-term up until a month ago when her prescription ran out. She felt that she had missed her vaccination program at [facility] due to insurance issue.  INTERVAL HISTORY: Please see below for problem oriented charting. She complained of persistent neuropathic and back pain. Denies recent infection. She has not taken vitamin D lately. No recent recurrent shingles  REVIEW OF  SYSTEMS:   Constitutional: Denies fevers, chills or abnormal weight loss Eyes: Denies blurriness of vision Ears, nose, mouth, throat, and face: Denies mucositis or sore throat Respiratory: Denies cough, dyspnea or wheezes Cardiovascular: Denies palpitation, chest discomfort or lower extremity swelling Gastrointestinal:  Denies nausea, heartburn or change in bowel habits Skin: Denies abnormal skin rashes Lymphatics: Denies new lymphadenopathy or easy bruising Neurological:Denies numbness, tingling or new weaknesses Behavioral/Psych: Mood is stable, no new changes  All other systems were reviewed with the patient and are negative.  I have reviewed the past medical history, past surgical history, social history and family history with the patient and they are unchanged from previous note.  ALLERGIES:  is allergic to ultram.  MEDICATIONS:  Current Outpatient Prescriptions  Medication Sig Dispense Refill  . fluticasone (FLONASE) 50 MCG/ACT nasal spray Place into both nostrils daily.    Marland Kitchen ibuprofen (ADVIL,MOTRIN) 200 MG tablet Take 800 mg by mouth every 4 (four) hours as needed for moderate pain. Pt. Stated,"i've taken up to 14 in one day."    . ranitidine (ZANTAC) 150 MG tablet Take 150 mg by mouth 2 (two) times daily.    . pregabalin (LYRICA) 50 MG capsule Take 1 capsule (50 mg total) by mouth 2 (two) times daily. 60 capsule 1   No current facility-administered medications for this visit.    PHYSICAL EXAMINATION: ECOG PERFORMANCE STATUS: 1 - Symptomatic but completely ambulatory  Filed Vitals:   05/23/14 1429  BP: 115/70  Pulse: 76  Temp: 97.7 F (36.5 C)  Resp: 18   Filed Weights   05/23/14 1429  Weight: 172 lb (78.019 kg)    GENERAL:alert, no distress and comfortable SKIN: skin color, texture,  turgor are normal, no rashes or significant lesions EYES: normal, Conjunctiva are pink and non-injected, sclera clear OROPHARYNX:no exudate, no erythema and lips, buccal mucosa, and  tongue normal  NECK: supple, thyroid normal size, non-tender, without nodularity LYMPH:  no palpable lymphadenopathy in the cervical, axillary or inguinal LUNGS: clear to auscultation and percussion with normal breathing effort HEART: regular rate & rhythm and no murmurs and no lower extremity edema ABDOMEN:abdomen soft, non-tender and normal bowel sounds Musculoskeletal:no cyanosis of digits and no clubbing  NEURO: alert & oriented x 3 with fluent speech, no focal motor/sensory deficits  LABORATORY DATA:  I have reviewed the data as listed    Component Value Date/Time   NA 142 05/23/2014 1415   K 4.2 05/23/2014 1415   CO2 27 05/23/2014 1415   GLUCOSE 106 05/23/2014 1415   BUN 22.4 05/23/2014 1415   CREATININE 0.9 05/23/2014 1415   CALCIUM 10.1 05/23/2014 1415   PROT 6.7 05/23/2014 1415   ALBUMIN 4.0 05/23/2014 1415   AST 18 05/23/2014 1415   ALT 23 05/23/2014 1415   ALKPHOS 71 05/23/2014 1415   BILITOT 0.39 05/23/2014 1415    No results found for: SPEP, UPEP  Lab Results  Component Value Date   WBC 4.5 05/23/2014   NEUTROABS 2.2 05/23/2014   HGB 13.5 05/23/2014   HCT 40.1 05/23/2014   MCV 96.2 05/23/2014   PLT 159 05/23/2014      Chemistry      Component Value Date/Time   NA 142 05/23/2014 1415   K 4.2 05/23/2014 1415   CO2 27 05/23/2014 1415   BUN 22.4 05/23/2014 1415   CREATININE 0.9 05/23/2014 1415      Component Value Date/Time   CALCIUM 10.1 05/23/2014 1415   ALKPHOS 71 05/23/2014 1415   AST 18 05/23/2014 1415   ALT 23 05/23/2014 1415   BILITOT 0.39 05/23/2014 1415      ASSESSMENT & PLAN:  AML (acute myeloid leukemia) in remission Her blood work and clinical exam suggests complete remission. I reassured the patient. However, she has a lot of other medical issues related to pain. I recommend she discuss this with PCP.  I will see her back in 6 months with repeat history, physical examination and blood work.   Vitamin D deficiency I recommend  high dose replacement therapy with 2000 units daily   Neuropathy associated with cancer I refilled her lyrica prescription and recommend she consult with pain service for chronic pain management   Preventive measure I have requested the pharmacy to check records from [facility] regarding her vaccination records.  She will be vaccinated today      Orders Placed This Encounter  Procedures  . CBC with Differential/Platelet    Standing Status: Future     Number of Occurrences:      Standing Expiration Date: 06/27/2015  . Comprehensive metabolic panel    Standing Status: Future     Number of Occurrences:      Standing Expiration Date: 06/27/2015  . Lactate dehydrogenase    Standing Status: Future     Number of Occurrences:      Standing Expiration Date: 06/27/2015  . Vitamin D 25 hydroxy    Standing Status: Future     Number of Occurrences:      Standing Expiration Date: 05/23/2015   All questions were answered. The patient knows to call the clinic with any problems, questions or concerns. No barriers to learning was detected. I spent 25 minutes counseling the  patient face to face. The total time spent in the appointment was 30 minutes and more than 50% was on counseling and review of test results     [facility], [place], MD 05/24/2014 7:40 PM

## 2014-05-24 NOTE — Assessment & Plan Note (Signed)
I refilled her lyrica prescription and recommend she consult with pain service for chronic pain management

## 2014-05-24 NOTE — Assessment & Plan Note (Signed)
I have requested the pharmacy to check records from [facility] regarding her vaccination records.  She will be vaccinated today

## 2014-05-24 NOTE — Assessment & Plan Note (Signed)
Her blood work and clinical exam suggests complete remission. I reassured the patient. However, she has a lot of other medical issues related to pain. I recommend she discuss this with PCP.  I will see her back in 6 months with repeat history, physical examination and blood work.

## 2014-05-24 NOTE — Assessment & Plan Note (Signed)
I recommend high dose replacement therapy with 2000 units daily

## 2014-06-19 ENCOUNTER — Other Ambulatory Visit: Payer: Self-pay | Admitting: Occupational Medicine

## 2014-06-19 ENCOUNTER — Ambulatory Visit: Payer: Self-pay

## 2014-06-19 DIAGNOSIS — M25561 Pain in right knee: Secondary | ICD-10-CM

## 2014-08-29 ENCOUNTER — Emergency Department (HOSPITAL_COMMUNITY)
Admission: EM | Admit: 2014-08-29 | Discharge: 2014-08-29 | Disposition: A | Discharge status: Home / Self Care | Payer: BLUE CROSS/BLUE SHIELD | Attending: Emergency Medicine | Admitting: Emergency Medicine

## 2014-08-29 ENCOUNTER — Encounter (HOSPITAL_COMMUNITY): Payer: Self-pay | Admitting: *Deleted

## 2014-08-29 ENCOUNTER — Emergency Department (HOSPITAL_COMMUNITY): Payer: BLUE CROSS/BLUE SHIELD

## 2014-08-29 DIAGNOSIS — Z8619 Personal history of other infectious and parasitic diseases: Secondary | ICD-10-CM | POA: Insufficient documentation

## 2014-08-29 DIAGNOSIS — G629 Polyneuropathy, unspecified: Secondary | ICD-10-CM | POA: Diagnosis not present

## 2014-08-29 DIAGNOSIS — Z9481 Bone marrow transplant status: Secondary | ICD-10-CM | POA: Diagnosis not present

## 2014-08-29 DIAGNOSIS — Z79899 Other long term (current) drug therapy: Secondary | ICD-10-CM | POA: Diagnosis not present

## 2014-08-29 DIAGNOSIS — G8929 Other chronic pain: Secondary | ICD-10-CM | POA: Insufficient documentation

## 2014-08-29 DIAGNOSIS — J45909 Unspecified asthma, uncomplicated: Secondary | ICD-10-CM | POA: Insufficient documentation

## 2014-08-29 DIAGNOSIS — Z856 Personal history of leukemia: Secondary | ICD-10-CM | POA: Insufficient documentation

## 2014-08-29 DIAGNOSIS — Z87828 Personal history of other (healed) physical injury and trauma: Secondary | ICD-10-CM | POA: Diagnosis not present

## 2014-08-29 DIAGNOSIS — M79652 Pain in left thigh: Secondary | ICD-10-CM | POA: Insufficient documentation

## 2014-08-29 DIAGNOSIS — M79659 Pain in unspecified thigh: Secondary | ICD-10-CM

## 2014-08-29 DIAGNOSIS — Z7951 Long term (current) use of inhaled steroids: Secondary | ICD-10-CM | POA: Insufficient documentation

## 2014-08-29 MED ORDER — NAPROXEN 500 MG PO TABS: 500.0000 mg | ORAL_TABLET | Freq: Two times a day (BID) | ORAL | Status: AC

## 2014-08-29 MED ORDER — HYDROCODONE-ACETAMINOPHEN 5-325 MG PO TABS: 1.0000 | ORAL_TABLET | Freq: Four times a day (QID) | ORAL | Status: DC | PRN
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
## 2014-08-29 NOTE — ED Notes (Signed)
Pt states that she began having left upper leg pain that started all of a sudden yesterday after taking a nap. Pt denies any known cause. Pt states that the area is tender to touch. Denies noticing any reddness or heat. Pain is worse with walking

## 2014-08-29 NOTE — ED Notes (Signed)
Pt asking something for pain

## 2014-08-29 NOTE — ED Notes (Signed)
Pt had sudden onset left lateral thigh pain. No injury. States had similar pain in the past in right leg which was a tumor.

## 2014-08-29 NOTE — Discharge Instructions (Signed)
Alternate heat with ice. Stretch. Try gentle massage. Naproxen for pain and inflammation as prescribed. Norco for severe pain only. Follow-up with her doctor for recheck.   Iliotibial Band Syndrome Iliotibial band syndrome is pain in the outer, lower thigh. The pain is caused by an inflammation of the iliotibial band. This is a band of thick fibrous tissue that runs down the outside of the thigh. The iliotibial band begins at the hip. It extends to the outer side of the shin bone (tibia) just below the knee joint. The band works with the thigh muscles. Together they provide stability to the outside of the knee joint. Iliotibial band syndrome occurs when there is inflammation to this band of tissue. This is typically due to over use and not due to an injury. The irritation usually occurs over the outside of the knee joint, at the the end of the thigh bone (femur). The iliotibial band crosses bone and muscle at this point. Between these structures is a cushioning sac (bursa). The bursa should make possible a smooth gliding motion. However, when inflamed, the iliotibial band does not glide easily. When inflamed, there is pain with motion of the knee. Usually the pain worsens with continued movement and the pain goes away with rest. This problem usually arises when there is a sudden increase in sports activities involving your legs. Running, and playing soccer or basketball are examples of activities causing this. Others who are prone to iliotibial band syndrome include individuals with mechanical problems such as leg length differences, abnormality of walking, bowed legs etc. HOME CARE INSTRUCTIONS   Apply ice to the injured area:  Put ice in a plastic bag.  Place a towel between your skin and the bag.  Leave the ice on for 20 minutes, 2-3 times a day.  Limit excessive training or eliminate training until pain goes away.  While pain is present, you may use gentle range of motion. Do not resume  regular use until instructed by your health care provider. Begin use gradually. Do not increase activity to the point of pain. If pain does develop, decrease activity and continue the above measures. Gradually increase activities that do not cause discomfort. Do this until you finally achieve normal use.  Perform low-impact activities while pain is present. Wear proper footwear.  Only take over-the-counter or prescription medicines for pain, discomfort, or fever as directed by your health care provider. SEEK MEDICAL CARE IF:   Your pain increases or pain is not controlled with medications.  You develop new, unexplained symptoms, or an increase of the symptoms that brought you to your health care provider.  Your pain and symptoms are not improving or are getting worse. Document Released: 09/10/2001 Document Revised: 01/09/2013 Document Reviewed: 10/18/2012 [facility] Patient Information 2015 [place], [place]. This information is not intended to replace advice given to you by your health care provider. Make sure you discuss any questions you have with your health care provider.

## 2014-11-19 ENCOUNTER — Telehealth: Payer: Self-pay | Admitting: *Deleted

## 2014-11-19 NOTE — Telephone Encounter (Signed)
1300 LM to call

## 2014-11-19 NOTE — Telephone Encounter (Signed)
LM to call regarding appt on Friday with Dr Alvy Bimler. We need to change to Thursday or Tuesday. Asked to call Dr Alvy Bimler' nurse to discuss the time.

## 2014-11-20 ENCOUNTER — Telehealth: Payer: Self-pay | Admitting: *Deleted

## 2014-11-20 NOTE — Telephone Encounter (Signed)
LVM for pt informing we need to r/s her appt tomorrow to either this afternoon or next Tuesday 8/23.  Asked her to please call us back to let us know if we can move appt to today or next week on Tuesday.

## 2014-11-21 ENCOUNTER — Ambulatory Visit: Payer: BLUE CROSS/BLUE SHIELD | Admitting: Hematology and Oncology

## 2014-11-21 ENCOUNTER — Encounter: Payer: Self-pay | Admitting: Hematology and Oncology

## 2014-11-21 ENCOUNTER — Other Ambulatory Visit: Payer: BLUE CROSS/BLUE SHIELD

## 2015-07-06 ENCOUNTER — Other Ambulatory Visit: Payer: Self-pay | Admitting: Neurosurgery

## 2015-07-08 ENCOUNTER — Other Ambulatory Visit (HOSPITAL_COMMUNITY): Payer: Self-pay | Admitting: *Deleted

## 2015-07-09 ENCOUNTER — Encounter (HOSPITAL_COMMUNITY): Payer: Self-pay

## 2015-07-09 ENCOUNTER — Encounter (HOSPITAL_COMMUNITY)
Admission: RE | Admit: 2015-07-09 | Discharge: 2015-07-09 | Disposition: A | Discharge status: Home / Self Care | Payer: 59 | Source: Ambulatory Visit | Attending: Neurosurgery | Admitting: Neurosurgery

## 2015-07-09 DIAGNOSIS — D496 Neoplasm of unspecified behavior of brain: Secondary | ICD-10-CM | POA: Insufficient documentation

## 2015-07-09 DIAGNOSIS — Z0183 Encounter for blood typing: Secondary | ICD-10-CM | POA: Diagnosis not present

## 2015-07-09 DIAGNOSIS — Z01812 Encounter for preprocedural laboratory examination: Secondary | ICD-10-CM | POA: Diagnosis not present

## 2015-07-09 HISTORY — DX: Headache: R51

## 2015-07-09 HISTORY — DX: Gastro-esophageal reflux disease without esophagitis: K21.9

## 2015-07-09 HISTORY — DX: Personal history of other medical treatment: Z92.89

## 2015-07-09 HISTORY — DX: Headache, unspecified: R51.9

## 2015-07-09 LAB — BASIC METABOLIC PANEL
Anion gap: 12 (ref 5–15)
BUN: 19 mg/dL (ref 6–20)
CALCIUM: 9.3 mg/dL (ref 8.9–10.3)
CHLORIDE: 106 mmol/L (ref 101–111)
CO2: 23 mmol/L (ref 22–32)
CREATININE: 1.14 mg/dL — AB (ref 0.44–1.00)
GFR calc non Af Amer: 55 mL/min — ABNORMAL LOW (ref >60)
Glucose, Bld: 105 mg/dL — ABNORMAL HIGH (ref 65–99)
Potassium: 4 mmol/L (ref 3.5–5.1)
SODIUM: 141 mmol/L (ref 135–145)

## 2015-07-09 LAB — TYPE AND SCREEN
ABO/RH(D): A NEG
Antibody Screen: NEGATIVE

## 2015-07-09 LAB — CBC
HCT: 38.7 % (ref 36.0–46.0)
HEMOGLOBIN: 12.6 g/dL (ref 12.0–15.0)
MCH: 31.5 pg (ref 26.0–34.0)
MCHC: 32.6 g/dL (ref 30.0–36.0)
MCV: 96.8 fL (ref 78.0–100.0)
PLATELETS: 159 10*3/uL (ref 150–400)
RBC: 4 MIL/uL (ref 3.87–5.11)
RDW: 13.4 % (ref 11.5–15.5)
WBC: 5.9 10*3/uL (ref 4.0–10.5)

## 2015-07-09 LAB — ABO/RH: ABO/RH(D): A NEG

## 2015-07-09 NOTE — Pre-Procedure Instructions (Signed)
Cynthia Steele  07/09/2015      [id number] - [place], [place] - [address] AT [address] [place] [address] Phone: [phone] Fax: [phone]    Your procedure is scheduled on 07/15/2015.  Report to [facility] Admitting at 8:00 A.M.  Call this number if you have problems the morning of surgery:  [phone]   Remember:  Do not eat food or drink liquids after midnight.  On Tuesday night   Take these medicines the morning of surgery with A SIP OF WATER : NOTHING               IBUPROFEN must be stopped now    Do not wear jewelry, make-up or nail polish.   Do not wear lotions, powders, or perfumes.  You may wear deodorant.   Do not shave 48 hours prior to surgery.     Do not bring valuables to the hospital.   [facility] is not responsible for any belongings or valuables.  Contacts, dentures or bridgework may not be worn into surgery.  Leave your suitcase in the car.  After surgery it may be brought to your room.  For patients admitted to the hospital, discharge time will be determined by your treatment team.  Patients discharged the day of surgery will not be allowed to drive home.   Name and phone number of your driver:   With family  Special instructions:  Special Instructions: [facility] - Preparing for Surgery  Before surgery, you can play an important role.  Because skin is not sterile, your skin needs to be as free of germs as possible.  You can reduce the number of germs on you skin by washing with CHG (chlorahexidine gluconate) soap before surgery.  CHG is an antiseptic cleaner which kills germs and bonds with the skin to continue killing germs even after washing.  Please DO NOT use if you have an allergy to CHG or antibacterial soaps.  If your skin becomes reddened/irritated stop using the CHG and inform your nurse when you arrive at Short Stay.  Do not shave (including legs and  underarms) for at least 48 hours prior to the first CHG shower.  You may shave your face.  Please follow these instructions carefully:   1.  Shower with CHG Soap the night before surgery and the  morning of Surgery.  2.  If you choose to wash your hair, wash your hair first as usual with your  normal shampoo.  3.  After you shampoo, rinse your hair and body thoroughly to remove the  Shampoo.  4.  Use CHG as you would any other liquid soap.  You can apply chg directly to the skin and wash gently with scrungie or a clean washcloth.  5.  Apply the CHG Soap to your body ONLY FROM THE NECK DOWN.    Do not use on open wounds or open sores.  Avoid contact with your eyes, ears, mouth and genitals (private parts).  Wash genitals (private parts)   with your normal soap.  6.  Wash thoroughly, paying special attention to the area where your surgery will be performed.  7.  Thoroughly rinse your body with warm water from the neck down.  8.  DO NOT shower/wash with your normal soap after using and rinsing off   the CHG Soap.  9.  Pat yourself dry with a clean towel.  10.  Wear clean pajamas.            11.  Place clean sheets on your bed the night of your first shower and do not sleep with pets.  Day of Surgery  Do not apply any lotions/deodorants the morning of surgery.  Please wear clean clothes to the hospital/surgery center.  Please read over the following fact sheets that you were given. Pain Booklet, Coughing and Deep Breathing, Blood Transfusion Information and Surgical Site Infection Prevention

## 2015-07-09 NOTE — Progress Notes (Signed)
Pt. Recently moved here fr. [facility], when she lived here previously she was followed by  [facility]. Prior to Tx for Leukemia, she had some cardiac testing, unsure what was done but told at that time she didn't have any abnormal findings.

## 2015-07-14 MED ORDER — CEFAZOLIN SODIUM-DEXTROSE 2-4 GM/100ML-% IV SOLN
2.0000 g | INTRAVENOUS | Status: AC
  Administered 2015-07-15: 2 g via INTRAVENOUS
  Filled 2015-07-14: qty 100

## 2015-07-14 NOTE — Anesthesia Preprocedure Evaluation (Signed)
Anesthesia Evaluation  Patient identified by MRN, date of birth, ID band Patient awake    Reviewed: Allergy & Precautions, H&P , Patient's Chart, lab work & pertinent test results, reviewed documented beta blocker date and time   Airway Mallampati: II  TM Distance: >3 FB Neck ROM: full    Dental no notable dental hx.    Pulmonary asthma ,    Pulmonary exam normal breath sounds clear to auscultation       Cardiovascular  Rhythm:regular Rate:Normal     Neuro/Psych    GI/Hepatic GERD  Medicated,  Endo/Other  Morbid obesity  Renal/GU      Musculoskeletal   Abdominal   Peds  Hematology   Anesthesia Other Findings   Reproductive/Obstetrics                             Anesthesia Physical Anesthesia Plan  ASA: III  Anesthesia Plan: General   Post-op Pain Management:    Induction: Intravenous  Airway Management Planned: Oral ETT  Additional Equipment: Arterial line  Intra-op Plan:   Post-operative Plan: Extubation in OR  Informed Consent: I have reviewed the patients History and Physical, chart, labs and discussed the procedure including the risks, benefits and alternatives for the proposed anesthesia with the patient or authorized representative who has indicated his/her understanding and acceptance.   Dental Advisory Given and Dental advisory given  Plan Discussed with: CRNA and Surgeon  Anesthesia Plan Comments: (  Discussed general anesthesia, including possible nausea, instrumentation of airway, sore throat,pulmonary aspiration, etc. I asked if the were any outstanding questions, or  concerns before we proceeded. )        Anesthesia Quick Evaluation

## 2015-07-15 ENCOUNTER — Inpatient Hospital Stay (HOSPITAL_COMMUNITY): Payer: 59 | Admitting: Certified Registered Nurse Anesthetist

## 2015-07-15 ENCOUNTER — Encounter (HOSPITAL_COMMUNITY)
Admission: RE | Disposition: A | Discharge status: Home / Self Care | Payer: Self-pay | Source: Ambulatory Visit | Attending: Neurosurgery

## 2015-07-15 ENCOUNTER — Inpatient Hospital Stay (HOSPITAL_COMMUNITY)
Admission: RE | Admit: 2015-07-15 | Discharge: 2015-07-20 | DRG: 821 | Disposition: A | Discharge status: Home / Self Care | Payer: 59 | Source: Ambulatory Visit | Attending: Neurosurgery | Admitting: Neurosurgery

## 2015-07-15 ENCOUNTER — Ambulatory Visit (HOSPITAL_COMMUNITY): Admission: RE | Admit: 2015-07-15 | Payer: 59 | Source: Ambulatory Visit | Admitting: Neurosurgery

## 2015-07-15 ENCOUNTER — Encounter (HOSPITAL_COMMUNITY): Payer: Self-pay | Admitting: Certified Registered Nurse Anesthetist

## 2015-07-15 DIAGNOSIS — Z9481 Bone marrow transplant status: Secondary | ICD-10-CM | POA: Diagnosis not present

## 2015-07-15 DIAGNOSIS — R51 Headache: Secondary | ICD-10-CM | POA: Diagnosis present

## 2015-07-15 DIAGNOSIS — Z7951 Long term (current) use of inhaled steroids: Secondary | ICD-10-CM

## 2015-07-15 DIAGNOSIS — K219 Gastro-esophageal reflux disease without esophagitis: Secondary | ICD-10-CM | POA: Diagnosis present

## 2015-07-15 DIAGNOSIS — Z6839 Body mass index (BMI) 39.0-39.9, adult: Secondary | ICD-10-CM

## 2015-07-15 DIAGNOSIS — C9201 Acute myeloblastic leukemia, in remission: Principal | ICD-10-CM | POA: Diagnosis present

## 2015-07-15 DIAGNOSIS — F419 Anxiety disorder, unspecified: Secondary | ICD-10-CM | POA: Diagnosis not present

## 2015-07-15 DIAGNOSIS — J45909 Unspecified asthma, uncomplicated: Secondary | ICD-10-CM | POA: Diagnosis present

## 2015-07-15 DIAGNOSIS — D496 Neoplasm of unspecified behavior of brain: Secondary | ICD-10-CM | POA: Diagnosis present

## 2015-07-15 DIAGNOSIS — Z79899 Other long term (current) drug therapy: Secondary | ICD-10-CM | POA: Diagnosis not present

## 2015-07-15 HISTORY — PX: CRANIOTOMY: SHX93

## 2015-07-15 HISTORY — PX: APPLICATION OF CRANIAL NAVIGATION: SHX6578

## 2015-07-15 LAB — GLUCOSE, CAPILLARY: GLUCOSE-CAPILLARY: 138 mg/dL — AB (ref 65–99)

## 2015-07-15 LAB — MRSA PCR SCREENING: MRSA BY PCR: NEGATIVE

## 2015-07-15 SURGERY — CRANIOTOMY TUMOR EXCISION
Anesthesia: General | Site: Head | Laterality: Right

## 2015-07-15 MED ORDER — LACTATED RINGERS IV SOLN
INTRAVENOUS | Status: DC
  Administered 2015-07-15: 09:00:00 via INTRAVENOUS

## 2015-07-15 MED ORDER — SODIUM CHLORIDE 0.9 % IV SOLN
INTRAVENOUS | Status: DC | PRN
  Administered 2015-07-15 (×2): via INTRAVENOUS

## 2015-07-15 MED ORDER — ESMOLOL HCL 100 MG/10ML IV SOLN
INTRAVENOUS | Status: DC | PRN
  Administered 2015-07-15: 30 mg via INTRAVENOUS

## 2015-07-15 MED ORDER — BUPIVACAINE HCL (PF) 0.5 % IJ SOLN
INTRAMUSCULAR | Status: DC | PRN
  Administered 2015-07-15: 5 mL

## 2015-07-15 MED ORDER — ALBUMIN HUMAN 5 % IV SOLN
INTRAVENOUS | Status: DC | PRN
  Administered 2015-07-15 (×3): via INTRAVENOUS

## 2015-07-15 MED ORDER — MIDAZOLAM HCL 5 MG/5ML IJ SOLN
INTRAMUSCULAR | Status: DC | PRN
  Administered 2015-07-15 (×2): 1 mg via INTRAVENOUS

## 2015-07-15 MED ORDER — VECURONIUM BROMIDE 10 MG IV SOLR
INTRAVENOUS | Status: DC | PRN
  Administered 2015-07-15: 2 mg via INTRAVENOUS
  Administered 2015-07-15: 4 mg via INTRAVENOUS
  Administered 2015-07-15: 6 mg via INTRAVENOUS
  Administered 2015-07-15: 3 mg via INTRAVENOUS
  Administered 2015-07-15 (×2): 2 mg via INTRAVENOUS
  Administered 2015-07-15: 3 mg via INTRAVENOUS

## 2015-07-15 MED ORDER — HYDROMORPHONE HCL 1 MG/ML IJ SOLN
0.2500 mg | INTRAMUSCULAR | Status: DC | PRN
  Administered 2015-07-15 (×2): 0.25 mg via INTRAVENOUS

## 2015-07-15 MED ORDER — CEFAZOLIN SODIUM-DEXTROSE 2-4 GM/100ML-% IV SOLN
2.0000 g | Freq: Three times a day (TID) | INTRAVENOUS | Status: AC
  Administered 2015-07-15 – 2015-07-16 (×2): 2 g via INTRAVENOUS
  Filled 2015-07-15 (×2): qty 100

## 2015-07-15 MED ORDER — LABETALOL HCL 5 MG/ML IV SOLN: 10.0000 mg | INTRAVENOUS | Status: DC | PRN

## 2015-07-15 MED ORDER — THROMBIN 5000 UNITS EX SOLR
OROMUCOSAL | Status: DC | PRN
  Administered 2015-07-15: 10 mL via TOPICAL

## 2015-07-15 MED ORDER — LIDOCAINE-EPINEPHRINE 1 %-1:100000 IJ SOLN
INTRAMUSCULAR | Status: DC | PRN
  Administered 2015-07-15: 5 mL

## 2015-07-15 MED ORDER — DOCUSATE SODIUM 100 MG PO CAPS
100.0000 mg | ORAL_CAPSULE | Freq: Two times a day (BID) | ORAL | Status: DC
  Administered 2015-07-15 – 2015-07-20 (×8): 100 mg via ORAL
  Filled 2015-07-15 (×8): qty 1

## 2015-07-15 MED ORDER — BACITRACIN ZINC 500 UNIT/GM EX OINT
TOPICAL_OINTMENT | CUTANEOUS | Status: DC | PRN
  Administered 2015-07-15: 1 via TOPICAL

## 2015-07-15 MED ORDER — BISACODYL 10 MG RE SUPP
10.0000 mg | Freq: Every day | RECTAL | Status: DC | PRN
  Administered 2015-07-19: 10 mg via RECTAL
  Filled 2015-07-15: qty 1

## 2015-07-15 MED ORDER — SENNA 8.6 MG PO TABS
1.0000 | ORAL_TABLET | Freq: Two times a day (BID) | ORAL | Status: DC
  Administered 2015-07-15 – 2015-07-20 (×8): 8.6 mg via ORAL
  Filled 2015-07-15 (×8): qty 1

## 2015-07-15 MED ORDER — SODIUM CHLORIDE 0.9 % IV SOLN
INTRAVENOUS | Status: DC | PRN
  Administered 2015-07-15: 09:00:00 via INTRAVENOUS

## 2015-07-15 MED ORDER — FENTANYL CITRATE (PF) 100 MCG/2ML IJ SOLN
INTRAMUSCULAR | Status: DC | PRN
  Administered 2015-07-15 (×3): 50 ug via INTRAVENOUS
  Administered 2015-07-15: 200 ug via INTRAVENOUS

## 2015-07-15 MED ORDER — ONDANSETRON HCL 4 MG PO TABS
4.0000 mg | ORAL_TABLET | ORAL | Status: DC | PRN
  Administered 2015-07-19 (×2): 4 mg via ORAL
  Filled 2015-07-15 (×2): qty 1

## 2015-07-15 MED ORDER — PROPOFOL 10 MG/ML IV BOLUS
INTRAVENOUS | Status: AC
  Filled 2015-07-15: qty 40

## 2015-07-15 MED ORDER — SODIUM CHLORIDE 0.9 % IV SOLN
0.0125 ug/kg/min | INTRAVENOUS | Status: DC
  Filled 2015-07-15 (×2): qty 2000

## 2015-07-15 MED ORDER — PROPOFOL 10 MG/ML IV BOLUS
INTRAVENOUS | Status: DC | PRN
  Administered 2015-07-15: 50 mg via INTRAVENOUS
  Administered 2015-07-15: 90 mg via INTRAVENOUS

## 2015-07-15 MED ORDER — FENTANYL CITRATE (PF) 250 MCG/5ML IJ SOLN
INTRAMUSCULAR | Status: AC
  Filled 2015-07-15: qty 5

## 2015-07-15 MED ORDER — HYDROMORPHONE HCL 1 MG/ML IJ SOLN
INTRAMUSCULAR | Status: AC
  Filled 2015-07-15: qty 1

## 2015-07-15 MED ORDER — SUGAMMADEX SODIUM 500 MG/5ML IV SOLN
INTRAVENOUS | Status: DC | PRN
  Administered 2015-07-15 (×2): 177.2 mg via INTRAVENOUS

## 2015-07-15 MED ORDER — EPHEDRINE SULFATE 50 MG/ML IJ SOLN
INTRAMUSCULAR | Status: DC | PRN
  Administered 2015-07-15: 10 mg via INTRAVENOUS

## 2015-07-15 MED ORDER — SODIUM CHLORIDE 0.9 % IV SOLN
500.0000 mg | Freq: Two times a day (BID) | INTRAVENOUS | Status: DC
  Administered 2015-07-15 – 2015-07-19 (×9): 500 mg via INTRAVENOUS
  Filled 2015-07-15 (×11): qty 5

## 2015-07-15 MED ORDER — DEXAMETHASONE SODIUM PHOSPHATE 4 MG/ML IJ SOLN
INTRAMUSCULAR | Status: DC | PRN
  Administered 2015-07-15: 10 mg via INTRAVENOUS

## 2015-07-15 MED ORDER — FLUTICASONE PROPIONATE 50 MCG/ACT NA SUSP
1.0000 | Freq: Every day | NASAL | Status: DC
  Administered 2015-07-17 – 2015-07-20 (×4): 1 via NASAL
  Filled 2015-07-15: qty 16

## 2015-07-15 MED ORDER — SODIUM CHLORIDE 0.9 % IV SOLN
INTRAVENOUS | Status: DC
  Administered 2015-07-15 – 2015-07-18 (×5): via INTRAVENOUS
  Administered 2015-07-18: 75 mL/h via INTRAVENOUS

## 2015-07-15 MED ORDER — HEMOSTATIC AGENTS (NO CHARGE) OPTIME
TOPICAL | Status: DC | PRN
  Administered 2015-07-15: 1 via TOPICAL

## 2015-07-15 MED ORDER — MICROFIBRILLAR COLL HEMOSTAT EX PADS: MEDICATED_PAD | CUTANEOUS | Status: DC | PRN

## 2015-07-15 MED ORDER — HYDROCODONE-ACETAMINOPHEN 5-325 MG PO TABS
1.0000 | ORAL_TABLET | ORAL | Status: DC | PRN
  Administered 2015-07-15 – 2015-07-19 (×11): 1 via ORAL
  Filled 2015-07-15 (×11): qty 1

## 2015-07-15 MED ORDER — LIDOCAINE HCL (CARDIAC) 20 MG/ML IV SOLN
INTRAVENOUS | Status: DC | PRN
  Administered 2015-07-15: 100 mg via INTRAVENOUS

## 2015-07-15 MED ORDER — PANTOPRAZOLE SODIUM 40 MG PO TBEC
80.0000 mg | DELAYED_RELEASE_TABLET | Freq: Every day | ORAL | Status: DC
  Administered 2015-07-16 – 2015-07-20 (×5): 80 mg via ORAL
  Filled 2015-07-15 (×6): qty 2

## 2015-07-15 MED ORDER — SURGIFOAM 100 EX MISC
CUTANEOUS | Status: DC | PRN
  Administered 2015-07-15: 20 mL via TOPICAL

## 2015-07-15 MED ORDER — 0.9 % SODIUM CHLORIDE (POUR BTL) OPTIME
TOPICAL | Status: DC | PRN
  Administered 2015-07-15 (×2): 1000 mL

## 2015-07-15 MED ORDER — SODIUM CHLORIDE 0.9 % IV SOLN
1000.0000 mg | INTRAVENOUS | Status: AC
  Administered 2015-07-15: 1000 mg via INTRAVENOUS
  Filled 2015-07-15: qty 10

## 2015-07-15 MED ORDER — PANTOPRAZOLE SODIUM 40 MG IV SOLR: 40.0000 mg | Freq: Every day | INTRAVENOUS | Status: DC

## 2015-07-15 MED ORDER — MORPHINE SULFATE (PF) 2 MG/ML IV SOLN
1.0000 mg | INTRAVENOUS | Status: DC | PRN
  Administered 2015-07-15 – 2015-07-16 (×5): 2 mg via INTRAVENOUS
  Filled 2015-07-15 (×6): qty 1

## 2015-07-15 MED ORDER — ONDANSETRON HCL 4 MG/2ML IJ SOLN
INTRAMUSCULAR | Status: DC | PRN
  Administered 2015-07-15: 4 mg via INTRAVENOUS

## 2015-07-15 MED ORDER — MIDAZOLAM HCL 2 MG/2ML IJ SOLN
INTRAMUSCULAR | Status: AC
  Filled 2015-07-15: qty 2

## 2015-07-15 MED ORDER — PHENYLEPHRINE HCL 10 MG/ML IJ SOLN
INTRAMUSCULAR | Status: DC | PRN
  Administered 2015-07-15 (×2): 80 ug via INTRAVENOUS

## 2015-07-15 MED ORDER — CEFAZOLIN SODIUM 1 G IJ SOLR
INTRAMUSCULAR | Status: DC | PRN
  Administered 2015-07-15: 2 g via INTRAMUSCULAR

## 2015-07-15 MED ORDER — SODIUM CHLORIDE 0.9 % IV SOLN
0.0125 ug/kg/min | INTRAVENOUS | Status: DC
  Filled 2015-07-15: qty 2000

## 2015-07-15 MED ORDER — SUGAMMADEX SODIUM 500 MG/5ML IV SOLN
INTRAVENOUS | Status: AC
  Filled 2015-07-15: qty 5

## 2015-07-15 MED ORDER — SODIUM CHLORIDE 0.9 % IV SOLN
0.0125 ug/kg/min | INTRAVENOUS | Status: AC
  Administered 2015-07-15: .1 ug/kg/min via INTRAVENOUS
  Administered 2015-07-15 (×2): via INTRAVENOUS
  Filled 2015-07-15: qty 2000

## 2015-07-15 MED ORDER — ONDANSETRON HCL 4 MG/2ML IJ SOLN
4.0000 mg | INTRAMUSCULAR | Status: DC | PRN
  Administered 2015-07-15 – 2015-07-19 (×13): 4 mg via INTRAVENOUS
  Filled 2015-07-15 (×13): qty 2

## 2015-07-15 MED ORDER — DEXTROSE 5 % IV SOLN
10.0000 mg | INTRAVENOUS | Status: DC | PRN
  Administered 2015-07-15: 25 ug/min via INTRAVENOUS

## 2015-07-15 MED ORDER — MICROFIBRILLAR COLL HEMOSTAT EX PADS
MEDICATED_PAD | CUTANEOUS | Status: DC | PRN
Start: 2015-07-15 — End: 2015-07-15

## 2015-07-15 MED ORDER — MANNITOL 25 % IV SOLN
INTRAVENOUS | Status: DC | PRN
  Administered 2015-07-15: 50 g via INTRAVENOUS

## 2015-07-15 SURGICAL SUPPLY — 111 items
APL SKNCLS STERI-STRIP NONHPOA (GAUZE/BANDAGES/DRESSINGS)
BANDAGE GAUZE 4  KLING STR (GAUZE/BANDAGES/DRESSINGS) ×1 IMPLANT
BATTERY IQ STERILE (MISCELLANEOUS) ×1 IMPLANT
BENZOIN TINCTURE PRP APPL 2/3 (GAUZE/BANDAGES/DRESSINGS) IMPLANT
BLADE CLIPPER SURG (BLADE) ×2 IMPLANT
BLADE SAW GIGLI 16 STRL (MISCELLANEOUS) IMPLANT
BLADE SURG 15 STRL LF DISP TIS (BLADE) IMPLANT
BLADE SURG 15 STRL SS (BLADE)
BLADE ULTRA TIP 2M (BLADE) ×2 IMPLANT
BNDG GAUZE ELAST 4 BULKY (GAUZE/BANDAGES/DRESSINGS) ×1 IMPLANT
BRUSH SCRUB EZ 1% IODOPHOR (MISCELLANEOUS) ×2 IMPLANT
BTRY SRG DRVR 1.5 IQ (MISCELLANEOUS) ×1
BUR ACORN 6.0 PRECISION (BURR) ×2 IMPLANT
BUR ADDG 1.1 (BURR) IMPLANT
BUR ROUND FLUTED 4 SOFT TCH (BURR) ×1 IMPLANT
BUR SPIRAL ROUTER 2.3 (BUR) ×2 IMPLANT
CANISTER SUCT 3000ML PPV (MISCELLANEOUS) ×4 IMPLANT
CATH VENTRIC 35X38 W/TROCAR LG (CATHETERS) IMPLANT
CLIP TI MEDIUM 6 (CLIP) IMPLANT
CONT SPEC 4OZ CLIKSEAL STRL BL (MISCELLANEOUS) ×3 IMPLANT
COVER MAYO STAND STRL (DRAPES) IMPLANT
DECANTER SPIKE VIAL GLASS SM (MISCELLANEOUS) ×2 IMPLANT
DRAIN SNY WOU 7FLT (WOUND CARE) IMPLANT
DRAIN SUBARACHNOID (WOUND CARE) IMPLANT
DRAPE MICROSCOPE LEICA (MISCELLANEOUS) IMPLANT
DRAPE NEUROLOGICAL W/INCISE (DRAPES) ×2 IMPLANT
DRAPE PROXIMA HALF (DRAPES) ×2 IMPLANT
DRAPE STERI IOBAN 125X83 (DRAPES) IMPLANT
DRAPE SURG 17X23 STRL (DRAPES) IMPLANT
DRAPE WARM FLUID 44X44 (DRAPE) ×2 IMPLANT
DRSG ADAPTIC 3X8 NADH LF (GAUZE/BANDAGES/DRESSINGS) IMPLANT
DRSG TELFA 3X8 NADH (GAUZE/BANDAGES/DRESSINGS) IMPLANT
DURAPREP 6ML APPLICATOR 50/CS (WOUND CARE) ×2 IMPLANT
ELECT REM PT RETURN 9FT ADLT (ELECTROSURGICAL) ×2
ELECTRODE REM PT RTRN 9FT ADLT (ELECTROSURGICAL) ×1 IMPLANT
EVACUATOR 1/8 PVC DRAIN (DRAIN) IMPLANT
EVACUATOR SILICONE 100CC (DRAIN) IMPLANT
FORCEPS BIPOLAR SPETZLER 8 1.0 (NEUROSURGERY SUPPLIES) ×2 IMPLANT
GAUZE SPONGE 4X4 12PLY STRL (GAUZE/BANDAGES/DRESSINGS) ×2 IMPLANT
GAUZE SPONGE 4X4 16PLY XRAY LF (GAUZE/BANDAGES/DRESSINGS) IMPLANT
GLOVE BIO SURGEON STRL SZ8 (GLOVE) ×2 IMPLANT
GLOVE BIOGEL PI IND STRL 6.5 (GLOVE) IMPLANT
GLOVE BIOGEL PI IND STRL 7.5 (GLOVE) ×1 IMPLANT
GLOVE BIOGEL PI INDICATOR 6.5 (GLOVE) ×2
GLOVE BIOGEL PI INDICATOR 7.5 (GLOVE) ×3
GLOVE ECLIPSE 6.5 STRL STRAW (GLOVE) ×2 IMPLANT
GLOVE ECLIPSE 7.0 STRL STRAW (GLOVE) IMPLANT
GLOVE ECLIPSE 7.5 STRL STRAW (GLOVE) ×2 IMPLANT
GLOVE EXAM NITRILE LRG STRL (GLOVE) IMPLANT
GLOVE EXAM NITRILE MD LF STRL (GLOVE) IMPLANT
GLOVE EXAM NITRILE XL STR (GLOVE) IMPLANT
GLOVE EXAM NITRILE XS STR PU (GLOVE) IMPLANT
GLOVE INDICATOR 8.5 STRL (GLOVE) ×2 IMPLANT
GLOVE SURG SS PI 6.5 STRL IVOR (GLOVE) ×4 IMPLANT
GOWN STRL REUS W/ TWL LRG LVL3 (GOWN DISPOSABLE) ×2 IMPLANT
GOWN STRL REUS W/ TWL XL LVL3 (GOWN DISPOSABLE) IMPLANT
GOWN STRL REUS W/TWL 2XL LVL3 (GOWN DISPOSABLE) ×1 IMPLANT
GOWN STRL REUS W/TWL LRG LVL3 (GOWN DISPOSABLE) ×4
GOWN STRL REUS W/TWL XL LVL3 (GOWN DISPOSABLE) ×8
HEMOSTAT POWDER KIT SURGIFOAM (HEMOSTASIS) ×2 IMPLANT
HEMOSTAT SURGICEL 2X14 (HEMOSTASIS) ×1 IMPLANT
IV NS 1000ML (IV SOLUTION) ×2
IV NS 1000ML BAXH (IV SOLUTION) ×1 IMPLANT
KIT BASIN OR (CUSTOM PROCEDURE TRAY) ×2 IMPLANT
KIT DRAIN CSF ACCUDRAIN (MISCELLANEOUS) IMPLANT
KIT ROOM TURNOVER OR (KITS) ×2 IMPLANT
KNIFE ARACHNOID DISP AM-24-S (MISCELLANEOUS) ×2 IMPLANT
MARKER SPHERE PSV REFLC 13MM (MARKER) ×10 IMPLANT
NDL HYPO 25X1 1.5 SAFETY (NEEDLE) ×1 IMPLANT
NDL SPNL 18GX3.5 QUINCKE PK (NEEDLE) IMPLANT
NEEDLE HYPO 25X1 1.5 SAFETY (NEEDLE) ×2 IMPLANT
NEEDLE SPNL 18GX3.5 QUINCKE PK (NEEDLE) IMPLANT
NS IRRIG 1000ML POUR BTL (IV SOLUTION) ×2 IMPLANT
PACK CRANIOTOMY (CUSTOM PROCEDURE TRAY) ×2 IMPLANT
PAD DRESSING TELFA 3X8 NADH (GAUZE/BANDAGES/DRESSINGS) IMPLANT
PAD EYE OVAL STERILE LF (GAUZE/BANDAGES/DRESSINGS) IMPLANT
PATTIES SURGICAL .25X.25 (GAUZE/BANDAGES/DRESSINGS) IMPLANT
PATTIES SURGICAL .5 X.5 (GAUZE/BANDAGES/DRESSINGS) IMPLANT
PATTIES SURGICAL .5 X3 (DISPOSABLE) IMPLANT
PATTIES SURGICAL 1/4 X 3 (GAUZE/BANDAGES/DRESSINGS) IMPLANT
PATTIES SURGICAL 1X1 (DISPOSABLE) IMPLANT
PIN MAYFIELD SKULL DISP (PIN) ×2 IMPLANT
PLATE 1.5  2HOLE LNG NEURO (Plate) ×1 IMPLANT
PLATE 1.5 2HOLE LNG NEURO (Plate) IMPLANT
PLATE 1.5 4HOLE LONG STRAIGHT (Plate) ×1 IMPLANT
PLATE 1.5 5HOLE XLONG Y (Plate) ×1 IMPLANT
RUBBERBAND STERILE (MISCELLANEOUS) IMPLANT
SCREW SELF DRILL HT 1.5/4MM (Screw) ×7 IMPLANT
SET TUBING W/EXT DISP (INSTRUMENTS) ×1 IMPLANT
SPECIMEN JAR SMALL (MISCELLANEOUS) IMPLANT
SPONGE NEURO XRAY DETECT 1X3 (DISPOSABLE) IMPLANT
SPONGE SURGIFOAM ABS GEL 100 (HEMOSTASIS) ×2 IMPLANT
STAPLER VISISTAT 35W (STAPLE) ×2 IMPLANT
STOCKINETTE 6  STRL (DRAPES)
STOCKINETTE 6 STRL (DRAPES) IMPLANT
SUT ETHILON 3 0 FSL (SUTURE) IMPLANT
SUT ETHILON 3 0 PS 1 (SUTURE) IMPLANT
SUT NURALON 4 0 TR CR/8 (SUTURE) ×6 IMPLANT
SUT PROLENE 6 0 BV (SUTURE) ×1 IMPLANT
SUT SILK 0 TIES 10X30 (SUTURE) IMPLANT
SUT VIC AB 0 CT1 18XCR BRD8 (SUTURE) ×2 IMPLANT
SUT VIC AB 0 CT1 8-18 (SUTURE) ×6
SUT VIC AB 3-0 SH 8-18 (SUTURE) ×5 IMPLANT
TIP SONASTAR STD MISONIX 1.9 (TRAY / TRAY PROCEDURE) IMPLANT
TIP STRAIGHT 25KHZ (INSTRUMENTS) ×2 IMPLANT
TOWEL OR 17X24 6PK STRL BLUE (TOWEL DISPOSABLE) ×2 IMPLANT
TOWEL OR 17X26 10 PK STRL BLUE (TOWEL DISPOSABLE) ×2 IMPLANT
TRAY FOLEY W/METER SILVER 14FR (SET/KITS/TRAYS/PACK) ×2 IMPLANT
TUBE CONNECTING 12X1/4 (SUCTIONS) ×2 IMPLANT
UNDERPAD 30X30 INCONTINENT (UNDERPADS AND DIAPERS) ×2 IMPLANT
WATER STERILE IRR 1000ML POUR (IV SOLUTION) ×2 IMPLANT

## 2015-07-15 NOTE — Op Note (Signed)
PREOP DIAGNOSIS:  1. Right Meckel's cave lesion   POSTOP DIAGNOSIS: Same  PROCEDURE: 1. Right temporal craniotomy 2. Subtemporal, extradural approach to Meckel's cave for subtotal tumor resection 3. Use of intraoperative stereotaxy 4. Use of intraoperative microscope  SURGEON: Dr. Consuella Lose, MD  ASSISTANT: Dr. Kary Kos, MD  ANESTHESIA: General Endotracheal  EBL: 200cc  SPECIMENS: Right Meckel's cave mass  DRAINS: none  COMPLICATIONS: none immediate  CONDITION: hemodynamically stable to PACU  HISTORY: Cynthia Steele is a 50 y.o. female who initially presented to the outpatient neurosurgery clinic with headache, and right-sided partial ptosis. She is unable to undergo MRI due to implantation of a spinal cord stimulator, however she had previously undergone thin cut CT scan with contrast which demonstrated an enhancing lesion in the posterior lateral cavernous sinus/Meckel's cave region. Her case was discussed at the multidisciplinary neuro oncology conference, and recommendation was for biopsy/resection prior to any possible radiation treatment. This situation was therefore discussed with the patient. Risks and benefits of the surgery were explained in detail. All her questions were answered.  PROCEDURE IN DETAIL: After informed consent was obtained and witnessed, the patient was brought to the operating room. After induction of general anesthesia, the patient was positioned on the operative table in the left lateral decubitus position in the Mayfield head holder. All pressure points were meticulously padded. Surface markers were then co-registered with the stereotactic CT scan of until satisfactory accuracy was achieved. Skin incision was then marked out and prepped and draped in the usual sterile fashion.  After timeout was conducted, reverse question mark skin incision was infiltrated with local anesthetic with epinephrine. Incision was then made sharply, and Bovie  electrocautery was used to dissect the subcutaneous tissue and the galea was incised. Hemostasis on skin edges was achieved with Raney clips. The temporal fat pad was incised, and the skin flap was elevated and retracted anteriorly. The temporalis muscle was then incised and reflected inferiorly. The stereotactic system was used to plan out a temporal craniotomy as close to the floor of the middle fossa as possible. Bur holes were then created and connected with the craniotome. The high-speed drill was used to drill down the lateral aspect of the temporal bone flush with the floor of the middle fossa. At this point the microscope was draped sterilely and brought into the field, and the remainder of the case was done under the microscope using microdissection.   The temporal dura was elevated off the floor of the middle fossa. In this manner, the extradural approach was carried medially, until the middle meningeal artery in the foramen spinosum was identified. This was coagulated and divided. The mandibular and maxillary divisions of the trigeminal nerve were identified, and the dura propria was incised, and peeled away from the nerves toward the trigeminal ganglion. No identifiable tumor was seen in the extradural space. We therefore incised the dura overlying the proximal V3 and the distal trigeminal ganglion. The underlying tissue appeared to be can in color, but did appear abnormal. It did not have the normal white appearance of the trigeminal nerve. There is no clear distinction between the mass lesion and the nerve fibers of the trigeminal nerve. A pseudo-plane was developed, and small pieces of the tumor were removed with pituitary rongeurs and sent for frozen and permanent pathology. In discussion with the pathologist, specimens were clearly abnormal, not normal nerve, but further characterization was not possible under frozen section. We therefore obtained more specimens with pituitary rongeur, but it  became clear that removal of the tumor entirely would be impossible, given the lack of delineation between the tumor and the trigeminal nerve itself.   At this point, hemostasis was achieved with a combination of bipolar electrocautery and morcellized Gelfoam with thrombin. The wound was irrigated with copious amounts of normal saline irrigation. The bone flap was then replaced and secured with standard titanium plates and screws. The temporalis muscle was then reapproximated with interrupted 0 Vicryl stitches. The galea was reapproximated with interrupted 3-0 Vicryl stitches, and the skin was closed with staples. Mayfield head holder was then removed, and sterile dressing was applied. The patient was then transferred to the stretcher, extubated, and taken to the postanesthesia care unit in stable hemodynamic condition.  At the end of the case all sponge, needle,  cottonoid, and instrument counts were correct.

## 2015-07-15 NOTE — Anesthesia Procedure Notes (Signed)
Procedure Name: Intubation Date/Time: 07/15/2015 11:03 AM Performed by: Bethel Born Pre-anesthesia Checklist: Patient identified, Timeout performed, Emergency Drugs available, Suction available and Patient being monitored Patient Re-evaluated:Patient Re-evaluated prior to inductionOxygen Delivery Method: Circle system utilized Preoxygenation: Pre-oxygenation with 100% oxygen Intubation Type: IV induction Ventilation: Mask ventilation without difficulty and Oral airway inserted - appropriate to patient size Laryngoscope Size: 2 and Miller Grade View: Grade II Tube type: Oral Tube size: 7.0 mm Number of attempts: 1 Placement Confirmation: ETT inserted through vocal cords under direct vision,  breath sounds checked- equal and bilateral and positive ETCO2 Secured at: 22 cm Tube secured with: Tape Dental Injury: Teeth and Oropharynx as per pre-operative assessment

## 2015-07-15 NOTE — H&P (Signed)
CC:  No chief complaint on file.   HPI: Cynthia Steele is a 50 year old woman I'm seeing for a primary complaint of several months of headache, and more recently double vision. She says symptoms started back in November, initially beginning with an episode of dizziness when she stood up. This progressed to what is now constant pain which she describes being in the back of her head, and sometimes a throbbing sensation on the left side of her neck. She has occasional shooting pains down her body, with associated tingling in both her hands and both her feet. Over the last 2 weeks or so, she has also experienced double vision, and noted that her right eyelid seems to be drooping. She does not have any double vision if she closes one eye or the other. She has been back and forth from the emergency department several times because of the symptoms, and says that she has only been treated for a "headache." None of the medications that she was placed on for the headaches have changed her symptoms. Of note, she has a history of AML, treated with allogenic bone marrow transplant. At her last oncologic follow-up, she was in remission. 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 Asthma   . Chronic low back pain   . Peripheral neuropathy ([place])   . H/O bone marrow transplant ([place])   . History of shingles 02/20/2014  . Vitamin D deficiency 05/23/2014  . GERD (gastroesophageal reflux disease)   . Headache   . Leukemia ([place]) 2013  . AML (acute myeloid leukemia) in remission ([place]) 02/20/2014  . History of blood transfusion     PSH: Past Surgical History  Procedure Laterality Date  . Bone marrow transplant    . Spinal cord stimulator implant      [employer]   . Shoulder surgery Right     x2  . Abdominal hysterectomy    . Cholecystectomy      SH: Social History  Substance Use Topics  . Smoking  status: Never Smoker   . Smokeless tobacco: Never Used  . Alcohol Use: No    MEDS: Prior to Admission medications   Medication Sig Start Date End Date Taking? Authorizing Provider  fluticasone (FLONASE) 50 MCG/ACT nasal spray Place into both nostrils daily.   Yes Historical Provider, MD  loratadine-pseudoephedrine (CLARITIN-D 12-HOUR) 5-120 MG tablet Take 1 tablet by mouth daily as needed.    Yes Historical Provider, MD  omeprazole (PRILOSEC) 40 MG capsule Take 40 mg by mouth 2 (two) times daily as needed. For acid reflux   Yes Historical Provider, MD  OVER THE COUNTER MEDICATION Take 1-4 drops by mouth 4 (four) times daily. CBD Oil   Yes Historical Provider, MD  HYDROcodone-acetaminophen (NORCO) 5-325 MG per tablet Take 1 tablet by mouth every 6 (six) hours as needed for moderate pain. Patient not taking: Reported on 07/07/2015 08/29/14   Tatyana Kirichenko, PA-C  ibuprofen (ADVIL,MOTRIN) 200 MG tablet Take 1,000 mg by mouth every 8 (eight) hours as needed for moderate pain. Pt. Stated,"i've taken up to 14 in one day." 05/23/14   Historical Provider, MD  naproxen (NAPROSYN) 500 MG tablet Take 1 tablet (500 mg total) by mouth 2 (two) times daily. Patient not taking: Reported on 07/07/2015 08/29/14   Tatyana Kirichenko, PA-C  pregabalin (LYRICA) 50 MG capsule Take 1 capsule (50 mg total) by mouth 2 (  two) times daily. Patient not taking: Reported on 07/07/2015 05/23/14   Heath Lark, MD  ranitidine (ZANTAC) 150 MG tablet Take 150 mg by mouth 2 (two) times daily. 05/23/14   Historical Provider, MD    ALLERGY: Allergies  Allergen Reactions  . Ultram [Tramadol] Shortness Of Breath  . Promethazine Other (See Comments)    Caused her to have a fever    ROS: ROS  NEUROLOGIC EXAM: Awake, alert, oriented Memory and concentration grossly intact Speech fluent, appropriate CN grossly intact x mild right ptosis Motor exam: Upper Extremities Deltoid Bicep Tricep Grip  Right 5/5 5/5 5/5 5/5  Left 5/5 5/5  5/5 5/5   Lower Extremity IP Quad PF DF EHL  Right 5/5 5/5 5/5 5/5 5/5  Left 5/5 5/5 5/5 5/5 5/5   Sensation grossly intact to LT  [facility]: CT of the brain with and without contrast was reviewed. This appears to demonstrate a homogeneously enhancing mass centered around the posterior lateral region of the right cavernous sinus. There does not appear to be any bony invasion of the middle fossa floor, or any obvious enlargement of the middle cranial fossa foramina. 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 Most likely possibilities are schwannoma or possibly meningioma. It was the recommendation of the tumor conference that the patient undergo biopsy of this lesion prior to possible radiation treatment pending pathology results   PLAN: stereotactic craniotomy for biopsy/resection of the lateral cavernous sinus/Meckel's cave lesion   I did review the recommendation above with the patient and her daughter. Risks of the proposed surgery were discussed, including the risk of cranial nerve injury, carotid artery injury, seizure, hydrocephalus, bleeding, and infection. Anticipated postoperative course was reviewed. The patient and her daughter understand our discussion, and are willing to proceed as above. All questions were answered.

## 2015-07-15 NOTE — Transfer of Care (Signed)
Immediate Anesthesia Transfer of Care Note  Patient: Cynthia Steele  Procedure(s) Performed: Procedure(s) with comments: CRANIOTOMY TUMOR EXCISION w/Curve (Right) - Right frontotempral craniotomy for resection of tumor with brainlab APPLICATION OF CRANIAL NAVIGATION (Right) - APPLICATION OF CRANIAL NAVIGATION  Patient Location: PACU  Anesthesia Type:General  Level of Consciousness: awake, alert , oriented and patient cooperative  Airway & Oxygen Therapy: Patient Spontanous Breathing and Patient connected to nasal cannula oxygen  Post-op Assessment: Report given to RN and Post -op Vital signs reviewed and stable  Post vital signs: Reviewed and stable  Last Vitals:  Filed Vitals:   07/15/15 0758  BP: 122/81  Pulse: 83  Temp: 37 C  Resp: 20    Complications: No apparent anesthesia complications

## 2015-07-16 ENCOUNTER — Encounter (HOSPITAL_COMMUNITY): Payer: Self-pay | Admitting: Neurosurgery

## 2015-07-16 LAB — GLUCOSE, CAPILLARY
GLUCOSE-CAPILLARY: 143 mg/dL — AB (ref 65–99)
GLUCOSE-CAPILLARY: 102 mg/dL — AB (ref 65–99)
GLUCOSE-CAPILLARY: 76 mg/dL (ref 65–99)
Glucose-Capillary: 145 mg/dL — ABNORMAL HIGH (ref 65–99)
Glucose-Capillary: 143 mg/dL — ABNORMAL HIGH (ref 65–99)
Glucose-Capillary: 134 mg/dL — ABNORMAL HIGH (ref 65–99)
Glucose-Capillary: 106 mg/dL — ABNORMAL HIGH (ref 65–99)

## 2015-07-16 MED ORDER — LORAZEPAM 2 MG/ML IJ SOLN
INTRAMUSCULAR | Status: AC
  Filled 2015-07-16: qty 1

## 2015-07-16 MED ORDER — LORAZEPAM 2 MG/ML IJ SOLN
0.5000 mg | Freq: Four times a day (QID) | INTRAMUSCULAR | Status: DC | PRN
  Administered 2015-07-16: 0.5 mg via INTRAVENOUS
  Administered 2015-07-16: 1 mg via INTRAVENOUS
  Filled 2015-07-16: qty 1

## 2015-07-16 MED ORDER — LORAZEPAM 0.5 MG PO TABS
0.5000 mg | ORAL_TABLET | Freq: Three times a day (TID) | ORAL | Status: DC | PRN
  Filled 2015-07-16: qty 1

## 2015-07-16 MED ORDER — HYDROMORPHONE HCL 1 MG/ML IJ SOLN
0.5000 mg | INTRAMUSCULAR | Status: DC | PRN
Start: 2015-07-16 — End: 2015-07-20
  Administered 2015-07-16 – 2015-07-19 (×13): 0.5 mg via INTRAVENOUS
  Filled 2015-07-16 (×13): qty 1

## 2015-07-16 NOTE — Progress Notes (Signed)
Pt seen and examined. No issues overnight. Reports some HA and nausea. Right side facial numbness significantly improved, has some tongue numbness.  EXAM: Temp:  [97 F (36.1 C)-98.4 F (36.9 C)] 98.2 F (36.8 C) (04/13 1200) Pulse Rate:  [71-113] 87 (04/13 0900) Resp:  [9-26] 13 (04/13 0900) BP: (95-139)/(70-95) 95/73 mmHg (04/13 0900) SpO2:  [93 %-100 %] 98 % (04/13 0900) Arterial Line BP: (77-154)/(69-95) 84/76 mmHg (04/13 0900) Intake/Output      04/12 0701 - 04/13 0700 04/13 0701 - 04/14 0700   I.V. (mL/kg) 3025 (34.1) 300 (3.4)   IV Piggyback 960 210   Total Intake(mL/kg) 3985 (45) 510 (5.8)   Urine (mL/kg/hr) 6115 105 (0.2)   Blood 200    Total Output 6315 105   Net -2330 +405         Awake, alert, oriented Speech fluent CN intact x dec sensation right V2, V3 Good strength Wound c/d/i  LABS: Lab Results  Component Value Date   CREATININE 1.14* 07/09/2015   BUN 19 07/09/2015   NA 141 07/09/2015   K 4.0 07/09/2015   CL 106 07/09/2015   CO2 23 07/09/2015   Lab Results  Component Value Date   WBC 5.9 07/09/2015   HGB 12.6 07/09/2015   HCT 38.7 07/09/2015   MCV 96.8 07/09/2015   PLT 159 07/09/2015   IMPRESSION: - 50 y.o. female POD#1 s/p right temporal craniotomy for biopsy of right Meckel's cave lesion, doing well. Improving V2/V3 distribution numbness  PLAN: - Cont to monitor in ICU - Ativan 0.5mg  for anxiety - change morphine to dilaudid 0.5mg  - Mobilize today

## 2015-07-16 NOTE — Anesthesia Postprocedure Evaluation (Signed)
Anesthesia Post Note  Patient: Cynthia Steele  Procedure(s) Performed: Procedure(s) (LRB): CRANIOTOMY TUMOR EXCISION w/Curve (Right) APPLICATION OF CRANIAL NAVIGATION (Right)  Patient location during evaluation: PACU Anesthesia Type: General Level of consciousness: sedated Pain management: satisfactory to patient Vital Signs Assessment: post-procedure vital signs reviewed and stable Respiratory status: spontaneous breathing Cardiovascular status: stable Anesthetic complications: no    Last Vitals:  Filed Vitals:   07/16/15 0800 07/16/15 0900  BP: 108/76 95/73  Pulse: 86 87  Temp: 36.8 C   Resp: 17 13    Last Pain:  Filed Vitals:   07/16/15 0905  PainSc: 8                  Kiandre Spagnolo EDWARD

## 2015-07-16 NOTE — Care Management Note (Signed)
Case Management Note  Patient Details  Name: Cynthia Steele MRN: [id number] Date of Birth: 04/16/1965  Subjective/Objective:   Patient is s/p craniotomy for brain tumor, c/o ha and nausea today, plan to mobilize today.  NCM will cont to follow for dc needs.               Action/Plan:   Expected Discharge Date:                  Expected Discharge Plan:  [place]  In-House Referral:     Discharge planning Services  CM Consult  Post Acute Care Choice:    Choice offered to:     DME Arranged:    DME Agency:     HH Arranged:    [facility] Agency:     Status of Service:  In process, will continue to follow  Medicare Important Message Given:    Date Medicare IM Given:    Medicare IM give by:    Date Additional Medicare IM Given:    Additional Medicare Important Message give by:     If discussed at [place] of Stay Meetings, dates discussed:    Additional Comments:  Zenon Mayo, RN 07/16/2015, 2:51 PM

## 2015-07-17 LAB — GLUCOSE, CAPILLARY
GLUCOSE-CAPILLARY: 82 mg/dL (ref 65–99)
GLUCOSE-CAPILLARY: 91 mg/dL (ref 65–99)
Glucose-Capillary: 100 mg/dL — ABNORMAL HIGH (ref 65–99)
Glucose-Capillary: 97 mg/dL (ref 65–99)
Glucose-Capillary: 94 mg/dL (ref 65–99)

## 2015-07-17 NOTE — Progress Notes (Signed)
Patient ID: Cynthia Steele, female   DOB: 02/23/1966, 50 y.o.   MRN: [id number] Subjective: Patient reports some nausea.  Objective: Vital signs in last 24 hours: Temp:  [98.2 F (36.8 C)-99 F (37.2 C)] 99 F (37.2 C) (04/14 0800) Pulse Rate:  [64-96] 88 (04/14 1000) Resp:  [12-22] 17 (04/14 1000) BP: (84-132)/(61-92) 132/88 mmHg (04/14 1000) SpO2:  [90 %-100 %] 99 % (04/14 1000)  Intake/Output from previous day: 04/13 0701 - 04/14 0700 In: 2295 [I.V.:1875; IV Piggyback:420] Out: [id number] [Urine:1305] Intake/Output this shift: Total I/O In: 225 [I.V.:225] Out: 675 [Urine:675]  Awake and alert, incision okay, right face swollen, can open eyes and EOM  Lab Results: Lab Results  Component Value Date   WBC 5.9 07/09/2015   HGB 12.6 07/09/2015   HCT 38.7 07/09/2015   MCV 96.8 07/09/2015   PLT 159 07/09/2015   No results found for: INR, PROTIME BMET Lab Results  Component Value Date   NA 141 07/09/2015   K 4.0 07/09/2015   CL 106 07/09/2015   CO2 23 07/09/2015   GLUCOSE 105* 07/09/2015   BUN 19 07/09/2015   CREATININE 1.14* 07/09/2015   CALCIUM 9.3 07/09/2015    Studies/Results: No results found.  Assessment/Plan: Seems to be doing fairly well. Mobilize as tolerated.   LOS: 2 days    JONES,DAVID S 07/17/2015, 10:37 AM

## 2015-07-18 NOTE — Progress Notes (Signed)
Patient transferred 21mw. S/p craniotomy tumor excision.  Family to bedside. Patient alert and oriented x4. Oriented to room and placed callbell in reach. Pt c/o headache this am and vicodin one tablet po prn given. Will monitor.

## 2015-07-18 NOTE — Progress Notes (Signed)
At 0800 when rn was getting vitals pt stated she needed to urinate, rn asked if pt wanted to get up and go to the bathroom. Pt said she did not want to get up at this time. Pt called out at 0900 saying she needed to urinate, rn went into room, pt stated "I asked to use the bathroom 2 hours ago, but no one came to help me".  rn reminded pt that at 0800 pt said she needed to use the bathroom, but that she did not want to get up.  Pt complaining that the IV machine noise was annoying her. Says she is sensitive to light and sound. Asking IV pole be moved to block sunlight that was shining on her face even with the blinds down. rn got towels and taped them to the windows and then put the blinds down again. Pt said that helped to block the sunlight.

## 2015-07-18 NOTE — Progress Notes (Signed)
Upon rn assessment, pt repots her tongue has been numb since surgery, bottom of Right foot has been numb since before surgery. Headache 10/10.  Right facial mouth droop noted. Pt unable to open right eye as much as left, even when trying. Pt reports she has been hearing "popping" since 0600. Denied it was a specific ear. Just "heard popping in her head".   Pt complained of headache and then said "I wish I could reach over and smack my daughter", rn asked if pt said that because it would make her headache feel better. Pt said "no, it would just make me feel better".  rn talked with pts daughter in private and daughter reports pt normally jokes like this and pts personality has not changed.

## 2015-07-18 NOTE — Progress Notes (Signed)
Checked with md, pt will not be discharged today, maybe tomorrow.

## 2015-07-18 NOTE — Progress Notes (Signed)
Pt stated she was hungry, given applesauce to eat per pt request.

## 2015-07-18 NOTE — Progress Notes (Addendum)
Paged md to make him aware of pts reported symptoms.  rn spoke with md not concerned about symptoms, will assess pt this morning.

## 2015-07-18 NOTE — Progress Notes (Signed)
Pt continues to complain about IV machine noise, rn ordered new IV, switched to this machine. Will see if this IV machine is less noisy.

## 2015-07-18 NOTE — Progress Notes (Signed)
Patient ID: Cynthia Steele, female   DOB: 09-28-1965, 50 y.o.   MRN: [id number] Cc to be doing well. Some headache. Some facial numbness. Right face still swollen. Moving all extremities well. We'll transfer to floor.

## 2015-07-19 MED ORDER — OXYCODONE-ACETAMINOPHEN 5-325 MG PO TABS
1.0000 | ORAL_TABLET | ORAL | Status: DC | PRN
  Administered 2015-07-19 – 2015-07-20 (×6): 2 via ORAL
  Administered 2015-07-20: 1 via ORAL
  Filled 2015-07-19 (×7): qty 2

## 2015-07-19 MED ORDER — LEVETIRACETAM 500 MG PO TABS: 500.0000 mg | ORAL_TABLET | Freq: Two times a day (BID) | ORAL | Status: DC

## 2015-07-19 MED ORDER — LEVETIRACETAM 500 MG PO TABS
500.0000 mg | ORAL_TABLET | Freq: Two times a day (BID) | ORAL | Status: DC
  Administered 2015-07-20: 500 mg via ORAL
  Filled 2015-07-19: qty 1

## 2015-07-19 NOTE — Progress Notes (Signed)
Patient still with intermittent severe incisional pain. Facial numbness and diplopia remain improved.  Awake and alert. Oriented and appropriate. Vision intact bilaterally. Extraocular movements full currently. Facial movement normal. Facial sensation decreased on right. Wound clean and dry. Motor and sensory function intact otherwise.  Overall progressing well. Pain control not adequate for discharge home. We'll change medicines and hopefully be able to get her home tomorrow.

## 2015-07-19 NOTE — Progress Notes (Signed)
Pt reports that eating/chewing foods causes pain to right teeth. Provided pt with orange sherbet.

## 2015-07-19 NOTE — Progress Notes (Signed)
Pt noted earlier that IV was starting to hurt, rn flushed IV which caused pain. rn stopped infusion and saline locked IV. Pt has IV medication due now. Attempted to flush which cause pain. rn attempted 2x IV unsuccessful. IV team paged.

## 2015-07-19 NOTE — Progress Notes (Signed)
Pt reports hx of shingles, 6 shingle outbreaks in last year, normally takes Architectural technologist for outbreaks, outbreaks occur on abdomen and right shoulder. Pt hx of fever blisters years ago.    Last night 4/15 at 2330 pt started having pain and heat to right upper lip. Now has blisters to the area. Pt reports while she was in ICU she had blisters to the inside of mouth on inner lips.. At present no blisters in mouth.   md at bedside, made aware of blisters on lips, he assessed, md believes this is due to where tape was place to secure ETT tube while pt was intubated.

## 2015-07-19 NOTE — Progress Notes (Signed)
rn saline locked pts IV so pt could bathe. When rn hooked IV back up and flushed IV, this caused pain. Pt reported IV was still painful after infusing for several minutes. Infusion stopped and IV saline locked. Will not run fluids through IV now. Will maintain IV and monitor if another IV needs to be placed.

## 2015-07-19 NOTE — Progress Notes (Signed)
Pt had very small loose bowel movement size of quarter.

## 2015-07-20 MED ORDER — OXYCODONE-ACETAMINOPHEN 5-325 MG PO TABS: 1.0000 | ORAL_TABLET | ORAL | Status: AC | PRN

## 2015-07-20 MED ORDER — METHYLPREDNISOLONE 4 MG PO TBPK: ORAL_TABLET | ORAL | Status: AC

## 2015-07-20 MED ORDER — OXYCODONE HCL 5 MG PO TABS
5.0000 mg | ORAL_TABLET | Freq: Once | ORAL | Status: AC
  Administered 2015-07-20: 5 mg via ORAL
  Filled 2015-07-20: qty 1

## 2015-07-20 MED ORDER — LEVETIRACETAM 500 MG PO TABS: 500.0000 mg | ORAL_TABLET | Freq: Two times a day (BID) | ORAL | Status: AC

## 2015-07-20 NOTE — Discharge Instructions (Signed)
Craniotomy A craniotomy is a surgical procedure in which part of the skull is temporarily removed (bone flap) to access the brain for surgery. The amount of skull that needs to be removed depends on the type of surgery being performed. The bone flap is put back in place after the surgery. It is usually fastened back in place with metal plates and screws. The plates and screws can often be removed later after healing. Brain surgery that involves craniotomy may be done for various reasons, including:  Cancer.  Traumatic brain injury (TBI).  Active bleeding inside the skull.  Removal of tumors or growths.  Swelling of the brain.  Removal of a collection of blood (hematoma).  Infections or abscesses.  Placing deep brain stimulators on the brain for the treatment of Parkinson's disease, epilepsy, or cerebellar tremors. LET [facility] CARE PROVIDER KNOW ABOUT:  Any allergies you have.  All medicines you are taking, including vitamins, herbs, eye drops, creams, and over-the-counter medicines.  Previous problems you or members of your family have had with the use of anesthetics.  Any blood disorders you have.  Previous surgeries you have had.  Medical conditions you have. RISKS AND COMPLICATIONS  Generally, this is a safe procedure. However, as with any procedure, problems can occur. Possible problems include:  Infection.  An allergic reaction to the anesthetic or other medicine given.  Injury from the pins.  Brain damage.  Bleeding.  Injury to facial muscles or to sinuses.  Seizures from brainirritability.  Brain swelling. BEFORE THE PROCEDURE  Ask your health care provider about changing or stopping your regular medicines.  Do noteat or drink anything after midnight the night before the procedure or as directed by your health care provider.  You may need to wash your hair with a special type of germ-killing shampoo the night before the surgery.  Make plans to  have someone drive you home after your hospital stay. Also, arrange to have someone help you with activities during recovery. PROCEDURE  A craniotomy may take 2 hours or longer depending on the problems being addressed in the surgery.  You will likely be given a medicine to make you sleep (general anesthetic). This will be given through an IV access tube in one of your veins.  Your scalp will be shaved and the site picked for the craniotomy will be prepped.  Your head will be held in place with a device. Pins will go into your skull so your head cannot move.  A flap will then be cut in the scalp, and small holes (burr holes) will be drilled in your skull.  A bone saw will be used to connect the burr holes and to cut out the bone flap.   After the bone flap is removed, work is performed on your brain.  A drain may be placed inside your head to remove blood or fluids that might collect after surgery.  The bone will then be put back in place and attached using plates, wires, or sutures. Your scalp will then be closed with stitches or staples.  AFTER THE PROCEDURE  You will be taken to a recovery area where your progress will be closely watched.  Once you are awake, stable, and taking fluids well, you will be taken to your hospital room.  You may stay in the hospital up to 3 weeks depending on the type of surgery done and whether any problems arise.  Your scalp may feel spongy for a while because of fluid  under it. This will gradually get better. Numbness may persist for a while in some areas of your scalp.   This information is not intended to replace advice given to you by your health care provider. Make sure you discuss any questions you have with your health care provider.   Document Released: 06/21/2005 Document Revised: 03/26/2013 Document Reviewed: 10/31/2012 Elsevier Interactive Patient Education [employer].

## 2015-07-20 NOTE — Progress Notes (Signed)
No issues overnight. Pt does have right temporal HA, some pain with chewing. Walking well, much better than preop. Cont to have facial numbness.  EXAM:  BP 129/71 mmHg  Pulse 61  Temp(Src) 98 F (36.7 C) (Oral)  Resp 18  Ht 5' (1.524 m)  Wt 91.1 kg (200 lb 13.4 oz)  BMI 39.22 kg/m2  SpO2 98%  Awake, alert, oriented  Speech fluent, appropriate  CN grossly intact x right V2,V3 numbness 5/5 BUE/BLE   IMPRESSION:  50 y.o. female s/p right temporal crani for biopsy of Meckel's cave lesion, doing well  PLAN: - Home today - Will d/c on steroid dosepak

## 2015-07-20 NOTE — Discharge Summary (Signed)
  Physician Discharge Summary  Patient ID: [name] MRN: [id number] DOB/AGE: [birth year] 50 y.o.  Admit date: 07/15/2015 Discharge date: 07/20/2015  Admission Diagnoses: Brain tumor  Discharge Diagnoses: Same Active Problems:   Brain tumor [facility])   Discharged Condition: Stable  Hospital Course:  Mrs. Cynthia Steele is a 50 y.o. female electively admitted after craniotomy for biopsy of right Meckel's cave lesion. The patient was observed in the ICU postop and was at baseline with the exception of right trigeminal distribution numbness. She was otherwise well, transferred to the floor, and discharged in stable condition.  Treatments: Surgery - Right craniotomy for biopsy of tumor  Discharge Exam: Blood pressure 129/71, pulse 61, temperature 98 F (36.7 C), temperature source Oral, resp. rate 18, height 5' (1.524 m), weight 91.1 kg (200 lb 13.4 oz), SpO2 98 %. Awake, alert, oriented Speech fluent, appropriate CN grossly intact x right V2, V3 numbness 5/5 BUE/BLE Wound c/d/i  Disposition: 01-Home or Self Care     Medication List    STOP taking these medications        HYDROcodone-acetaminophen 5-325 MG tablet  Commonly known as:  NORCO      TAKE these medications        fluticasone 50 MCG/ACT nasal spray  Commonly known as:  FLONASE  Place into both nostrils daily.     ibuprofen 200 MG tablet  Commonly known as:  ADVIL,MOTRIN  Take 1,000 mg by mouth every 8 (eight) hours as needed for moderate pain. Pt. Stated,"i've taken up to 14 in one day."     levETIRAcetam 500 MG tablet  Commonly known as:  KEPPRA  Take 1 tablet (500 mg total) by mouth 2 (two) times daily.     loratadine-pseudoephedrine 5-120 MG tablet  Commonly known as:  CLARITIN-D 12-hour  Take 1 tablet by mouth daily as needed.     methylPREDNISolone 4 MG Tbpk tablet  Commonly known as:  MEDROL DOSEPAK  Take tapering dose as directed on package     naproxen 500 MG tablet  Commonly known  as:  NAPROSYN  Take 1 tablet (500 mg total) by mouth 2 (two) times daily.     omeprazole 40 MG capsule  Commonly known as:  PRILOSEC  Take 40 mg by mouth 2 (two) times daily as needed. For acid reflux     OVER THE COUNTER MEDICATION  Take 1-4 drops by mouth 4 (four) times daily. CBD Oil     oxyCODONE-acetaminophen 5-325 MG tablet  Commonly known as:  PERCOCET/ROXICET  Take 1-2 tablets by mouth every 4 (four) hours as needed for moderate pain.     pregabalin 50 MG capsule  Commonly known as:  LYRICA  Take 1 capsule (50 mg total) by mouth 2 (two) times daily.     ranitidine 150 MG tablet  Commonly known as:  ZANTAC  Take 150 mg by mouth 2 (two) times daily.       Follow-up Information    Follow up with [facility], Marquie Aderhold, C, MD In 2 weeks.   Specialty:  Neurosurgery   Contact information:   [address] [place] [address] [phone]       Signed: Consuella Lose, Loletha Grayer 07/20/2015, 1:20 PM

## 2015-07-20 NOTE — Progress Notes (Signed)
Pt discharged home with family, by car, assessment stable, prescriptions given, discharge instructions reviewed, all questions answered. IV removed. Pt taken by wheelchair to exit. Time of discharge: 48  Pt is to call md office tomorrow to ask when she can shower and get staples wet.

## 2015-07-29 ENCOUNTER — Encounter (HOSPITAL_COMMUNITY): Payer: Self-pay

## 2015-10-03 DEATH — deceased

## 2017-04-03 IMAGING — CR DG FEMUR 2+V*L*
4 series · 4 of 4 positions shown · non-contrast
Comparison: None.

CLINICAL DATA: Pain, worse with weight-bearing.  No known trauma

EXAM:
LEFT FEMUR 2 VIEWS

[t femur proximal ap left]
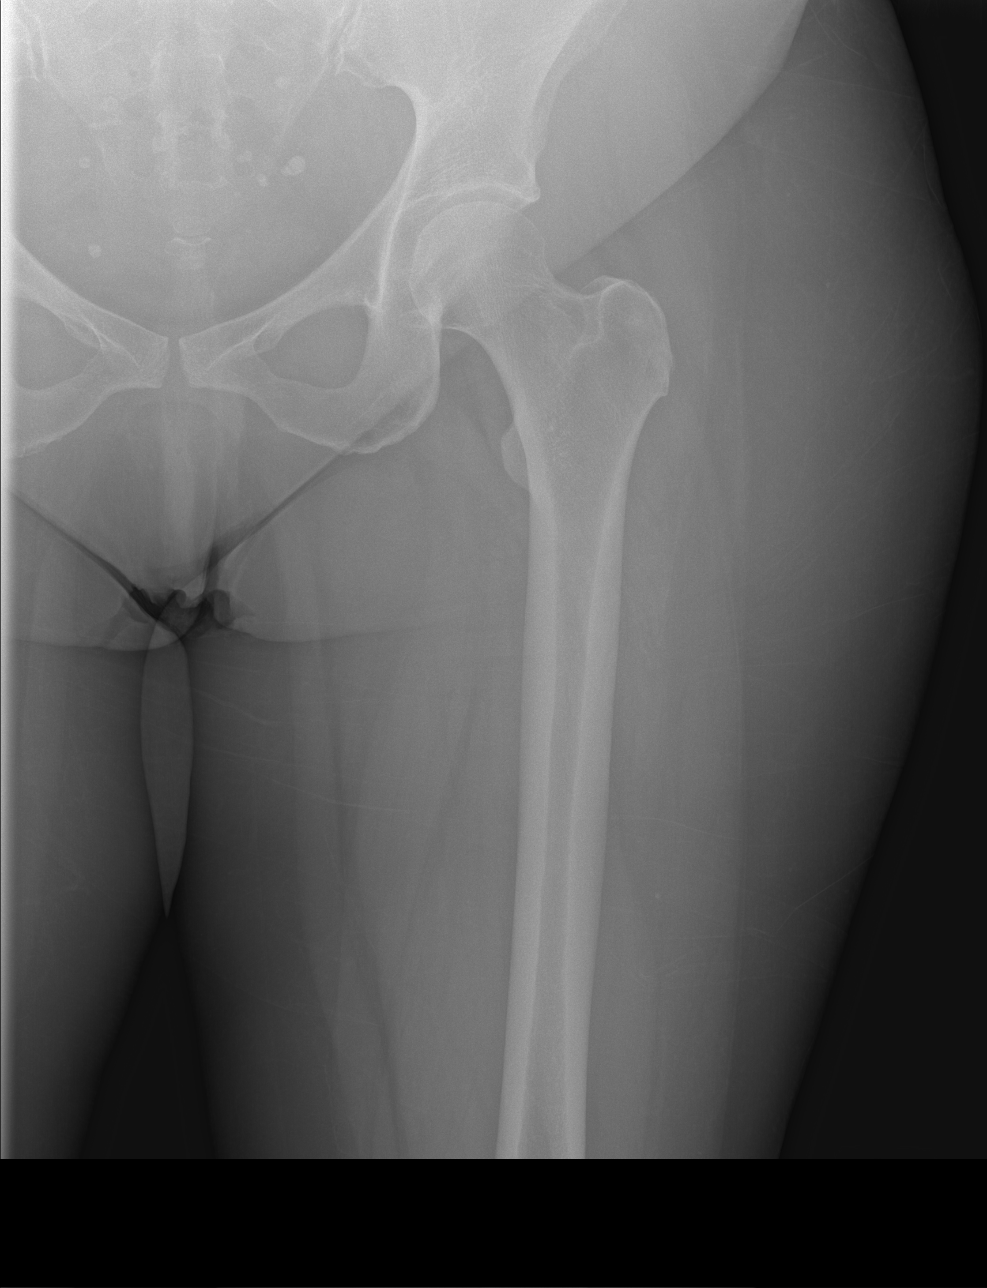

[t femur distal ap left]
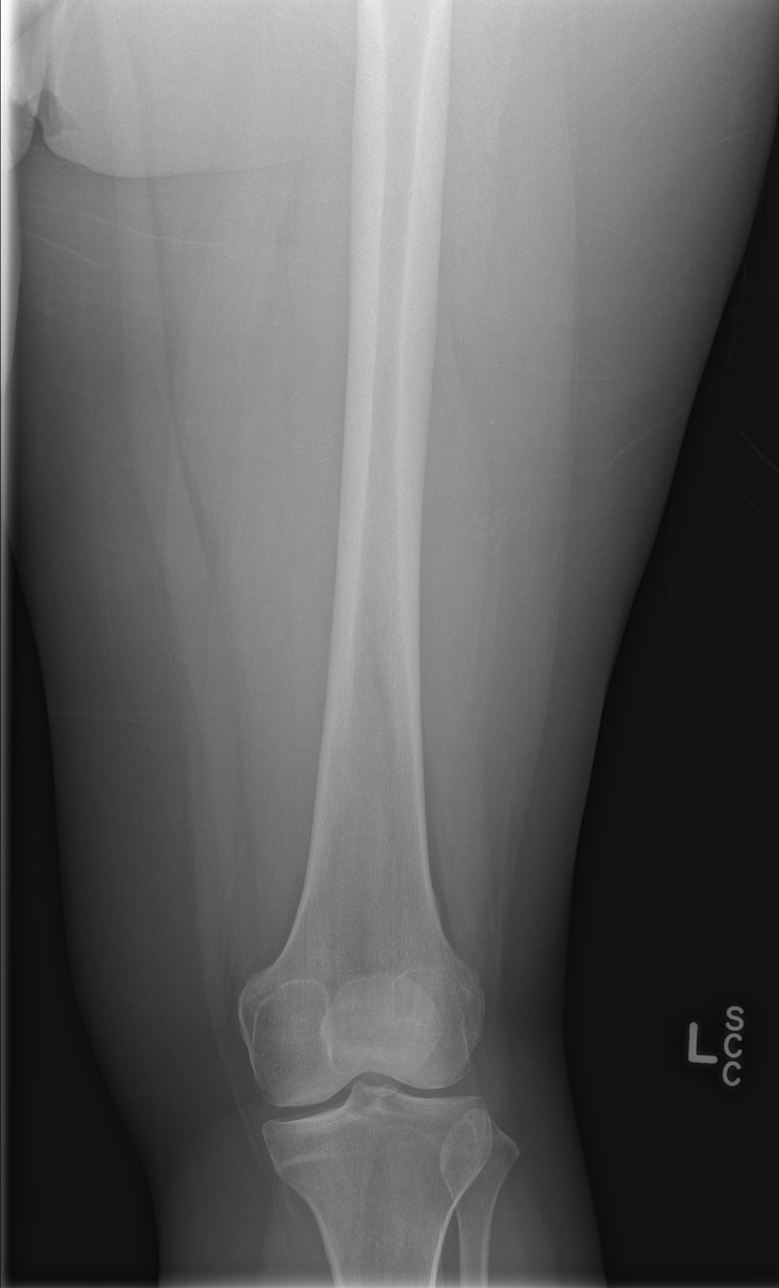

[t femur distal lat left]
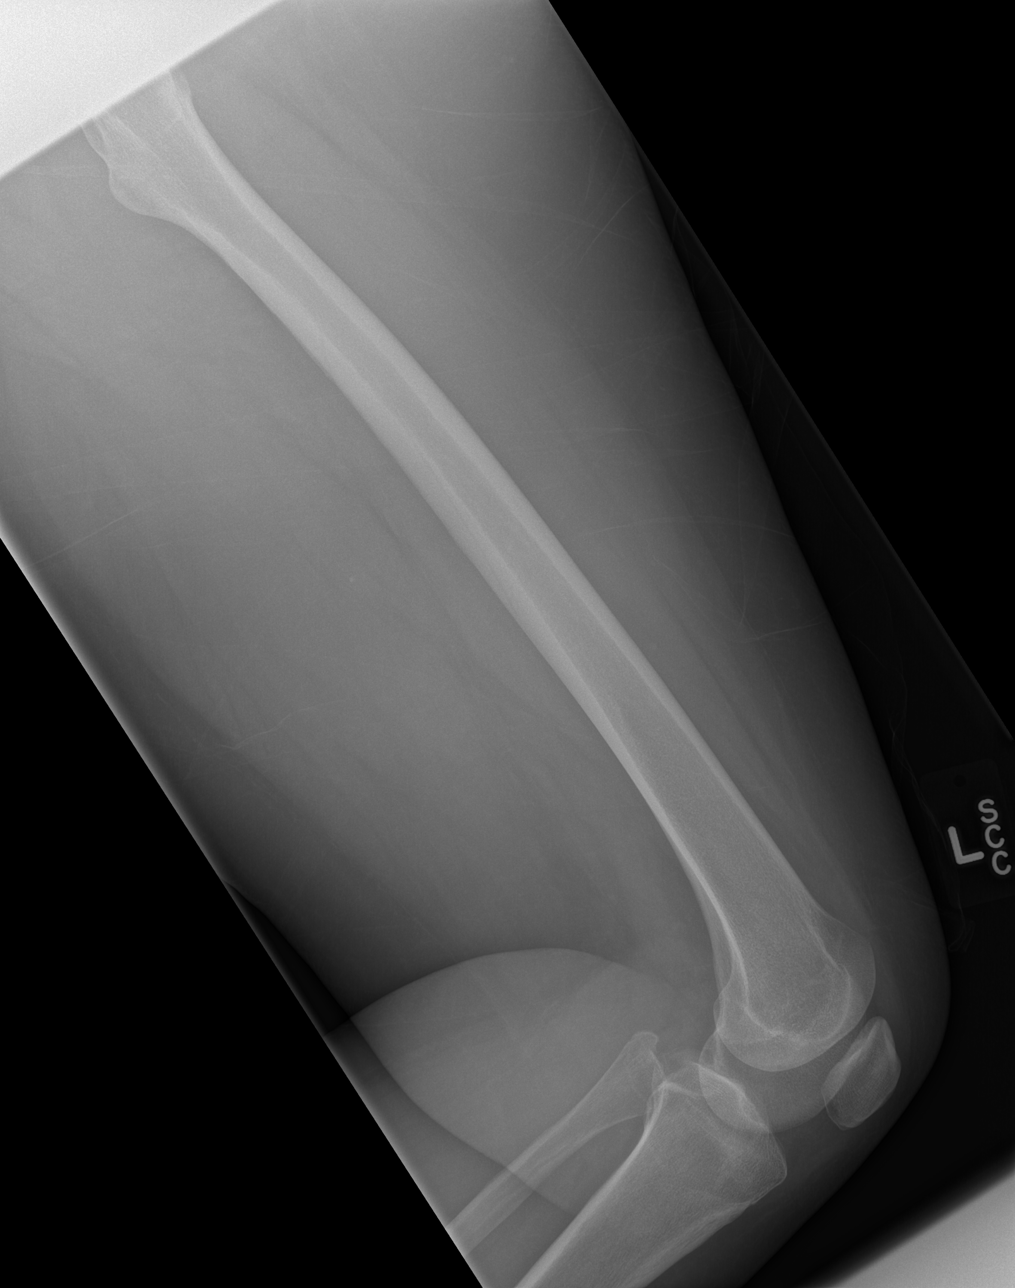

[t femur proximal lat left]
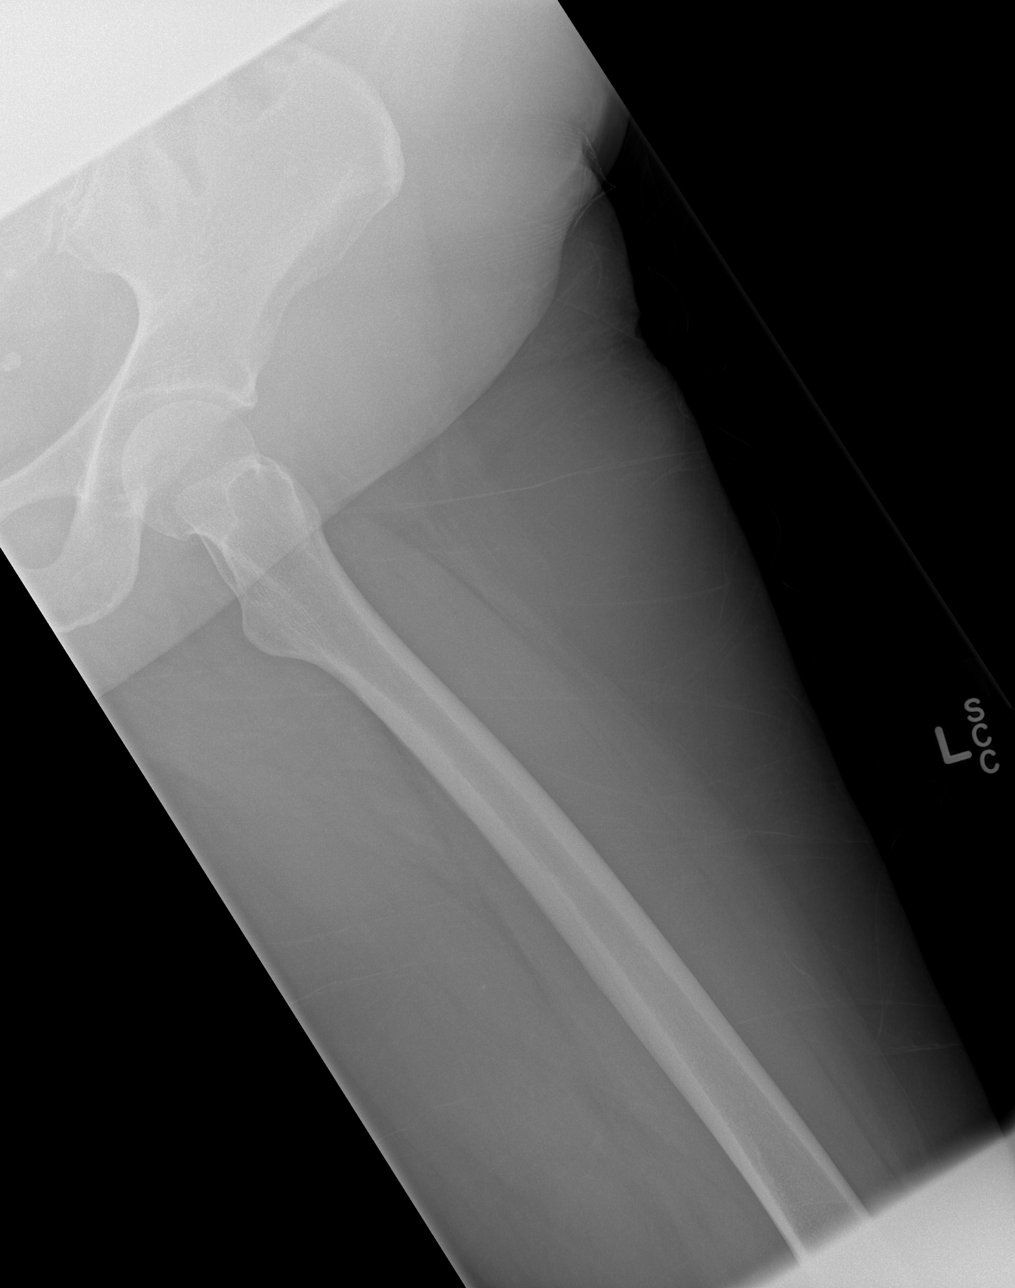

[4 of 4 positions shown; findings below may reference images not displayed]

FINDINGS: Frontal and lateral views were obtained. No fracture or dislocation.
Joint spaces appear intact. No abnormal periosteal reaction.
IMPRESSION: No abnormality noted.
